# Patient Record
Sex: Female | Born: 1955 | Race: White | Hispanic: No | Marital: Single | Smoking: Current every day smoker
Health system: Southern US, Community
[De-identification: ages and names within clinical notes are randomized; demographics above are authoritative.]

## PROBLEM LIST (undated history)

## (undated) DIAGNOSIS — I10 Essential (primary) hypertension: Secondary | ICD-10-CM

## (undated) DIAGNOSIS — J45909 Unspecified asthma, uncomplicated: Secondary | ICD-10-CM

## (undated) DIAGNOSIS — J449 Chronic obstructive pulmonary disease, unspecified: Secondary | ICD-10-CM

## (undated) HISTORY — PX: HERNIA REPAIR: SHX51

---

## 2003-06-19 ENCOUNTER — Encounter: Payer: Self-pay | Admitting: Emergency Medicine

## 2003-06-19 ENCOUNTER — Emergency Department (HOSPITAL_COMMUNITY): Admission: EM | Admit: 2003-06-19 | Discharge: 2003-06-19 | Payer: Self-pay | Admitting: Emergency Medicine

## 2008-05-06 ENCOUNTER — Encounter: Admission: RE | Admit: 2008-05-06 | Discharge: 2008-05-06 | Payer: Self-pay | Admitting: Neurosurgery

## 2009-10-07 ENCOUNTER — Emergency Department (HOSPITAL_COMMUNITY): Admission: EM | Admit: 2009-10-07 | Discharge: 2009-10-08 | Payer: Self-pay | Admitting: Emergency Medicine

## 2010-06-09 ENCOUNTER — Emergency Department (HOSPITAL_COMMUNITY): Admission: EM | Admit: 2010-06-09 | Discharge: 2010-06-09 | Payer: Self-pay | Admitting: Emergency Medicine

## 2011-01-15 LAB — URINALYSIS, ROUTINE W REFLEX MICROSCOPIC
Glucose, UA: NEGATIVE mg/dL
Specific Gravity, Urine: 1.015 (ref 1.005–1.030)
pH: 6.5 (ref 5.0–8.0)

## 2011-01-15 LAB — DIFFERENTIAL
Basophils Absolute: 0 10*3/uL (ref 0.0–0.1)
Lymphocytes Relative: 10 % — ABNORMAL LOW (ref 12–46)
Monocytes Absolute: 1 10*3/uL (ref 0.1–1.0)
Monocytes Relative: 7 % (ref 3–12)
Neutro Abs: 12.8 10*3/uL — ABNORMAL HIGH (ref 1.7–7.7)
Neutrophils Relative %: 82 % — ABNORMAL HIGH (ref 43–77)

## 2011-01-15 LAB — URINE MICROSCOPIC-ADD ON

## 2011-01-15 LAB — URINE CULTURE: Colony Count: 50000

## 2011-01-15 LAB — BASIC METABOLIC PANEL
Calcium: 9.6 mg/dL (ref 8.4–10.5)
GFR calc Af Amer: >60 mL/min (ref >60)
GFR calc non Af Amer: >60 mL/min (ref >60)
Sodium: 139 mEq/L (ref 135–145)

## 2011-01-15 LAB — CBC
Hemoglobin: 13.8 g/dL (ref 12.0–15.0)
RBC: 4.77 MIL/uL (ref 3.87–5.11)
RDW: 14.2 % (ref 11.5–15.5)

## 2012-06-26 ENCOUNTER — Other Ambulatory Visit: Payer: Self-pay | Admitting: Family Medicine

## 2012-06-26 DIAGNOSIS — IMO0002 Reserved for concepts with insufficient information to code with codable children: Secondary | ICD-10-CM

## 2012-06-26 DIAGNOSIS — M545 Low back pain: Secondary | ICD-10-CM

## 2012-06-30 ENCOUNTER — Inpatient Hospital Stay: Admission: RE | Admit: 2012-06-30 | Payer: Self-pay | Source: Ambulatory Visit

## 2012-07-04 ENCOUNTER — Ambulatory Visit
Admission: RE | Admit: 2012-07-04 | Discharge: 2012-07-04 | Disposition: A | Discharge status: Home / Self Care | Payer: Medicare Other | Source: Ambulatory Visit | Attending: Family Medicine | Admitting: Family Medicine

## 2012-07-04 DIAGNOSIS — M545 Low back pain: Secondary | ICD-10-CM

## 2012-07-04 DIAGNOSIS — IMO0002 Reserved for concepts with insufficient information to code with codable children: Secondary | ICD-10-CM

## 2012-07-10 ENCOUNTER — Inpatient Hospital Stay: Admission: RE | Admit: 2012-07-10 | Payer: Medicare Other | Source: Ambulatory Visit

## 2012-07-17 ENCOUNTER — Ambulatory Visit
Admission: RE | Admit: 2012-07-17 | Discharge: 2012-07-17 | Disposition: A | Discharge status: Home / Self Care | Payer: Medicare Other | Source: Ambulatory Visit | Attending: Family Medicine | Admitting: Family Medicine

## 2014-01-29 ENCOUNTER — Emergency Department (HOSPITAL_COMMUNITY): Payer: Medicare Other

## 2014-01-29 ENCOUNTER — Encounter (HOSPITAL_COMMUNITY): Payer: Self-pay | Admitting: Emergency Medicine

## 2014-01-29 ENCOUNTER — Inpatient Hospital Stay (HOSPITAL_COMMUNITY)
Admission: EM | Admit: 2014-01-29 | Discharge: 2014-02-04 | DRG: 291 | Disposition: A | Discharge status: Home Health | Payer: Medicare Other | Attending: Internal Medicine | Admitting: Internal Medicine

## 2014-01-29 DIAGNOSIS — Z23 Encounter for immunization: Secondary | ICD-10-CM

## 2014-01-29 DIAGNOSIS — R0689 Other abnormalities of breathing: Secondary | ICD-10-CM

## 2014-01-29 DIAGNOSIS — F3289 Other specified depressive episodes: Secondary | ICD-10-CM | POA: Diagnosis present

## 2014-01-29 DIAGNOSIS — Z6841 Body Mass Index (BMI) 40.0 and over, adult: Secondary | ICD-10-CM

## 2014-01-29 DIAGNOSIS — E874 Mixed disorder of acid-base balance: Secondary | ICD-10-CM | POA: Diagnosis present

## 2014-01-29 DIAGNOSIS — I10 Essential (primary) hypertension: Secondary | ICD-10-CM | POA: Diagnosis present

## 2014-01-29 DIAGNOSIS — Z79899 Other long term (current) drug therapy: Secondary | ICD-10-CM

## 2014-01-29 DIAGNOSIS — F329 Major depressive disorder, single episode, unspecified: Secondary | ICD-10-CM | POA: Diagnosis present

## 2014-01-29 DIAGNOSIS — L03319 Cellulitis of trunk, unspecified: Secondary | ICD-10-CM

## 2014-01-29 DIAGNOSIS — J441 Chronic obstructive pulmonary disease with (acute) exacerbation: Secondary | ICD-10-CM

## 2014-01-29 DIAGNOSIS — I517 Cardiomegaly: Secondary | ICD-10-CM | POA: Diagnosis not present

## 2014-01-29 DIAGNOSIS — K5900 Constipation, unspecified: Secondary | ICD-10-CM | POA: Diagnosis present

## 2014-01-29 DIAGNOSIS — J449 Chronic obstructive pulmonary disease, unspecified: Secondary | ICD-10-CM | POA: Diagnosis present

## 2014-01-29 DIAGNOSIS — E662 Morbid (severe) obesity with alveolar hypoventilation: Secondary | ICD-10-CM | POA: Diagnosis present

## 2014-01-29 DIAGNOSIS — R0609 Other forms of dyspnea: Secondary | ICD-10-CM | POA: Diagnosis not present

## 2014-01-29 DIAGNOSIS — J9602 Acute respiratory failure with hypercapnia: Secondary | ICD-10-CM | POA: Diagnosis present

## 2014-01-29 DIAGNOSIS — F17200 Nicotine dependence, unspecified, uncomplicated: Secondary | ICD-10-CM | POA: Diagnosis present

## 2014-01-29 DIAGNOSIS — E876 Hypokalemia: Secondary | ICD-10-CM | POA: Diagnosis present

## 2014-01-29 DIAGNOSIS — Z720 Tobacco use: Secondary | ICD-10-CM | POA: Diagnosis present

## 2014-01-29 DIAGNOSIS — R0602 Shortness of breath: Secondary | ICD-10-CM | POA: Diagnosis not present

## 2014-01-29 DIAGNOSIS — J9600 Acute respiratory failure, unspecified whether with hypoxia or hypercapnia: Secondary | ICD-10-CM | POA: Diagnosis present

## 2014-01-29 DIAGNOSIS — J90 Pleural effusion, not elsewhere classified: Secondary | ICD-10-CM | POA: Diagnosis not present

## 2014-01-29 DIAGNOSIS — F1390 Sedative, hypnotic, or anxiolytic use, unspecified, uncomplicated: Secondary | ICD-10-CM | POA: Diagnosis present

## 2014-01-29 DIAGNOSIS — I509 Heart failure, unspecified: Secondary | ICD-10-CM | POA: Diagnosis present

## 2014-01-29 DIAGNOSIS — L02219 Cutaneous abscess of trunk, unspecified: Secondary | ICD-10-CM | POA: Diagnosis present

## 2014-01-29 DIAGNOSIS — K7689 Other specified diseases of liver: Secondary | ICD-10-CM | POA: Diagnosis not present

## 2014-01-29 DIAGNOSIS — G4733 Obstructive sleep apnea (adult) (pediatric): Secondary | ICD-10-CM | POA: Diagnosis present

## 2014-01-29 DIAGNOSIS — D259 Leiomyoma of uterus, unspecified: Secondary | ICD-10-CM | POA: Diagnosis present

## 2014-01-29 DIAGNOSIS — R7301 Impaired fasting glucose: Secondary | ICD-10-CM | POA: Diagnosis present

## 2014-01-29 DIAGNOSIS — R0989 Other specified symptoms and signs involving the circulatory and respiratory systems: Secondary | ICD-10-CM | POA: Diagnosis not present

## 2014-01-29 DIAGNOSIS — L0390 Cellulitis, unspecified: Secondary | ICD-10-CM

## 2014-01-29 DIAGNOSIS — F1310 Sedative, hypnotic or anxiolytic abuse, uncomplicated: Secondary | ICD-10-CM

## 2014-01-29 DIAGNOSIS — I5021 Acute systolic (congestive) heart failure: Principal | ICD-10-CM | POA: Diagnosis present

## 2014-01-29 HISTORY — DX: Unspecified asthma, uncomplicated: J45.909

## 2014-01-29 HISTORY — DX: Essential (primary) hypertension: I10

## 2014-01-29 HISTORY — DX: Chronic obstructive pulmonary disease, unspecified: J44.9

## 2014-01-29 LAB — BLOOD GAS, ARTERIAL
Acid-Base Excess: 9.3 mmol/L — ABNORMAL HIGH (ref 0.0–2.0)
Bicarbonate: 36.7 mEq/L — ABNORMAL HIGH (ref 20.0–24.0)
Delivery systems: POSITIVE
Drawn by: 32526
EXPIRATORY PAP: 8
FIO2: 0.4 %
INSPIRATORY PAP: 18
O2 Saturation: 93.9 %
PATIENT TEMPERATURE: 98.6
PO2 ART: 76.7 mmHg — AB (ref 80.0–100.0)
TCO2: 39.4 mmol/L (ref 0–100)
pCO2 arterial: 90 mmHg (ref 35.0–45.0)
pH, Arterial: 7.234 — ABNORMAL LOW (ref 7.350–7.450)

## 2014-01-29 LAB — I-STAT ARTERIAL BLOOD GAS, ED
ACID-BASE EXCESS: 7 mmol/L — AB (ref 0.0–2.0)
Acid-Base Excess: 8 mmol/L — ABNORMAL HIGH (ref 0.0–2.0)
BICARBONATE: 38.6 meq/L — AB (ref 20.0–24.0)
BICARBONATE: 38.7 meq/L — AB (ref 20.0–24.0)
O2 Saturation: 93 %
O2 Saturation: 94 %
PCO2 ART: 83.9 mmHg — AB (ref 35.0–45.0)
PH ART: 7.27 — AB (ref 7.350–7.450)
PH ART: 7.244 — AB (ref 7.350–7.450)
PO2 ART: 80 mmHg (ref 80.0–100.0)
Patient temperature: 98.1
TCO2: 41 mmol/L (ref 0–100)
TCO2: 41 mmol/L (ref 0–100)
pCO2 arterial: 89.5 mmHg (ref 35.0–45.0)
pO2, Arterial: 89 mmHg (ref 80.0–100.0)

## 2014-01-29 LAB — COMPREHENSIVE METABOLIC PANEL
ALK PHOS: 88 U/L (ref 39–117)
ALT: 47 U/L — ABNORMAL HIGH (ref 0–35)
AST: 36 U/L (ref 0–37)
Albumin: 3.2 g/dL — ABNORMAL LOW (ref 3.5–5.2)
BILIRUBIN TOTAL: 0.2 mg/dL — AB (ref 0.3–1.2)
BUN: 18 mg/dL (ref 6–23)
CHLORIDE: 102 meq/L (ref 96–112)
CO2: 33 meq/L — AB (ref 19–32)
Calcium: 9.4 mg/dL (ref 8.4–10.5)
Creatinine, Ser: 0.75 mg/dL (ref 0.50–1.10)
GFR calc Af Amer: >90 mL/min (ref >90)
Glucose, Bld: 111 mg/dL — ABNORMAL HIGH (ref 70–99)
POTASSIUM: 4.9 meq/L (ref 3.7–5.3)
SODIUM: 144 meq/L (ref 137–147)
Total Protein: 7.1 g/dL (ref 6.0–8.3)

## 2014-01-29 LAB — BASIC METABOLIC PANEL
BUN: 17 mg/dL (ref 6–23)
BUN: 17 mg/dL (ref 6–23)
CO2: 34 mEq/L — ABNORMAL HIGH (ref 19–32)
CO2: 33 mEq/L — ABNORMAL HIGH (ref 19–32)
Calcium: 9.4 mg/dL (ref 8.4–10.5)
Calcium: 9.5 mg/dL (ref 8.4–10.5)
Chloride: 102 mEq/L (ref 96–112)
Chloride: 98 mEq/L (ref 96–112)
Creatinine, Ser: 0.71 mg/dL (ref 0.50–1.10)
Creatinine, Ser: 0.72 mg/dL (ref 0.50–1.10)
GFR calc Af Amer: >90 mL/min (ref >90)
GFR calc Af Amer: >90 mL/min (ref >90)
GFR calc non Af Amer: >90 mL/min (ref >90)
GLUCOSE: 126 mg/dL — AB (ref 70–99)
Glucose, Bld: 130 mg/dL — ABNORMAL HIGH (ref 70–99)
POTASSIUM: 5.1 meq/L (ref 3.7–5.3)
Potassium: 4.9 mEq/L (ref 3.7–5.3)
Sodium: 146 mEq/L (ref 137–147)
Sodium: 143 mEq/L (ref 137–147)

## 2014-01-29 LAB — I-STAT TROPONIN, ED: Troponin i, poc: 0.05 ng/mL (ref 0.00–0.08)

## 2014-01-29 LAB — CBC WITH DIFFERENTIAL/PLATELET
Basophils Absolute: 0.1 10*3/uL (ref 0.0–0.1)
Basophils Relative: 1 % (ref 0–1)
EOS PCT: 2 % (ref 0–5)
Eosinophils Absolute: 0.2 10*3/uL (ref 0.0–0.7)
HEMATOCRIT: 47.7 % — AB (ref 36.0–46.0)
HEMOGLOBIN: 14.6 g/dL (ref 12.0–15.0)
LYMPHS PCT: 18 % (ref 12–46)
Lymphs Abs: 1.9 10*3/uL (ref 0.7–4.0)
MCH: 28.5 pg (ref 26.0–34.0)
MCHC: 30.6 g/dL (ref 30.0–36.0)
MCV: 93 fL (ref 78.0–100.0)
MONO ABS: 0.6 10*3/uL (ref 0.1–1.0)
MONOS PCT: 6 % (ref 3–12)
NEUTROS ABS: 7.9 10*3/uL — AB (ref 1.7–7.7)
Neutrophils Relative %: 73 % (ref 43–77)
Platelets: 284 10*3/uL (ref 150–400)
RBC: 5.13 MIL/uL — ABNORMAL HIGH (ref 3.87–5.11)
RDW: 15.9 % — ABNORMAL HIGH (ref 11.5–15.5)
WBC: 10.7 10*3/uL — AB (ref 4.0–10.5)

## 2014-01-29 LAB — INFLUENZA PANEL BY PCR (TYPE A & B)
H1N1FLUPCR: NOT DETECTED
Influenza A By PCR: NEGATIVE
Influenza B By PCR: NEGATIVE

## 2014-01-29 LAB — PRO B NATRIURETIC PEPTIDE: Pro B Natriuretic peptide (BNP): 1011 pg/mL — ABNORMAL HIGH (ref 0–125)

## 2014-01-29 LAB — I-STAT CHEM 8, ED
BUN: 19 mg/dL (ref 6–23)
CALCIUM ION: 1.22 mmol/L (ref 1.12–1.23)
CHLORIDE: 98 meq/L (ref 96–112)
CREATININE: 1 mg/dL (ref 0.50–1.10)
GLUCOSE: 108 mg/dL — AB (ref 70–99)
HCT: 49 % — ABNORMAL HIGH (ref 36.0–46.0)
Hemoglobin: 16.7 g/dL — ABNORMAL HIGH (ref 12.0–15.0)
Potassium: 4.5 mEq/L (ref 3.7–5.3)
Sodium: 142 mEq/L (ref 137–147)
TCO2: 37 mmol/L (ref 0–100)

## 2014-01-29 LAB — RAPID URINE DRUG SCREEN, HOSP PERFORMED
AMPHETAMINES: NOT DETECTED
BENZODIAZEPINES: POSITIVE — AB
Barbiturates: NOT DETECTED
Cocaine: NOT DETECTED
Opiates: POSITIVE — AB
Tetrahydrocannabinol: NOT DETECTED

## 2014-01-29 LAB — MAGNESIUM
Magnesium: 2 mg/dL (ref 1.5–2.5)
Magnesium: 2 mg/dL (ref 1.5–2.5)

## 2014-01-29 LAB — I-STAT CG4 LACTIC ACID, ED: Lactic Acid, Venous: 0.98 mmol/L (ref 0.5–2.2)

## 2014-01-29 LAB — TSH: TSH: 1.27 u[IU]/mL (ref 0.350–4.500)

## 2014-01-29 LAB — TROPONIN I
Troponin I: <0.3 ng/mL (ref <0.30)
Troponin I: <0.3 ng/mL (ref <0.30)

## 2014-01-29 LAB — HEMOGLOBIN A1C
Hgb A1c MFr Bld: 6 % — ABNORMAL HIGH (ref <5.7)
Mean Plasma Glucose: 126 mg/dL — ABNORMAL HIGH (ref <117)

## 2014-01-29 LAB — HIV ANTIBODY (ROUTINE TESTING W REFLEX): HIV 1&2 Ab, 4th Generation: NONREACTIVE

## 2014-01-29 LAB — MRSA PCR SCREENING: MRSA BY PCR: NEGATIVE

## 2014-01-29 LAB — SALICYLATE LEVEL: Salicylate Lvl: <2 mg/dL — ABNORMAL LOW (ref 2.8–20.0)

## 2014-01-29 MED ORDER — ALBUTEROL SULFATE (2.5 MG/3ML) 0.083% IN NEBU
5.0000 mg | INHALATION_SOLUTION | Freq: Once | RESPIRATORY_TRACT | Status: AC
  Administered 2014-01-29: 5 mg via RESPIRATORY_TRACT
  Filled 2014-01-29: qty 6

## 2014-01-29 MED ORDER — ALBUTEROL SULFATE (2.5 MG/3ML) 0.083% IN NEBU: 5.0000 mg | INHALATION_SOLUTION | RESPIRATORY_TRACT | Status: DC | PRN

## 2014-01-29 MED ORDER — ENOXAPARIN SODIUM 40 MG/0.4ML ~~LOC~~ SOLN
40.0000 mg | SUBCUTANEOUS | Status: DC
  Filled 2014-01-29: qty 0.4

## 2014-01-29 MED ORDER — SODIUM CHLORIDE 0.9 % IJ SOLN
3.0000 mL | Freq: Two times a day (BID) | INTRAMUSCULAR | Status: DC
  Administered 2014-01-29 – 2014-02-04 (×13): 3 mL via INTRAVENOUS

## 2014-01-29 MED ORDER — IPRATROPIUM BROMIDE 0.02 % IN SOLN
0.5000 mg | Freq: Once | RESPIRATORY_TRACT | Status: AC
  Administered 2014-01-29: 0.5 mg via RESPIRATORY_TRACT
  Filled 2014-01-29: qty 2.5

## 2014-01-29 MED ORDER — PIPERACILLIN-TAZOBACTAM 3.375 G IVPB
3.3750 g | Freq: Once | INTRAVENOUS | Status: DC
  Filled 2014-01-29 (×2): qty 50

## 2014-01-29 MED ORDER — IPRATROPIUM-ALBUTEROL 0.5-2.5 (3) MG/3ML IN SOLN
3.0000 mL | RESPIRATORY_TRACT | Status: DC
  Administered 2014-01-29 – 2014-02-03 (×29): 3 mL via RESPIRATORY_TRACT
  Filled 2014-01-29 (×29): qty 3

## 2014-01-29 MED ORDER — ENOXAPARIN SODIUM 80 MG/0.8ML ~~LOC~~ SOLN
0.5000 mg/kg | SUBCUTANEOUS | Status: DC
  Administered 2014-01-29 – 2014-02-03 (×6): 70 mg via SUBCUTANEOUS
  Filled 2014-01-29 (×7): qty 0.8

## 2014-01-29 MED ORDER — PREDNISONE 20 MG PO TABS
60.0000 mg | ORAL_TABLET | Freq: Once | ORAL | Status: AC
  Administered 2014-01-29: 60 mg via ORAL
  Filled 2014-01-29: qty 3

## 2014-01-29 MED ORDER — METHYLPREDNISOLONE SODIUM SUCC 125 MG IJ SOLR
80.0000 mg | Freq: Once | INTRAMUSCULAR | Status: AC
  Administered 2014-01-29: 80 mg via INTRAVENOUS
  Filled 2014-01-29: qty 1.28

## 2014-01-29 MED ORDER — VITAMIN D3 25 MCG (1000 UNIT) PO TABS
2000.0000 [IU] | ORAL_TABLET | Freq: Two times a day (BID) | ORAL | Status: DC
  Administered 2014-01-30 – 2014-02-04 (×11): 2000 [IU] via ORAL
  Filled 2014-01-29 (×14): qty 2

## 2014-01-29 MED ORDER — ONDANSETRON HCL 4 MG PO TABS: 4.0000 mg | ORAL_TABLET | Freq: Four times a day (QID) | ORAL | Status: DC | PRN

## 2014-01-29 MED ORDER — PIPERACILLIN-TAZOBACTAM 3.375 G IVPB
3.3750 g | Freq: Three times a day (TID) | INTRAVENOUS | Status: DC
  Filled 2014-01-29 (×2): qty 50

## 2014-01-29 MED ORDER — DOXYCYCLINE HYCLATE 100 MG PO TABS
100.0000 mg | ORAL_TABLET | Freq: Two times a day (BID) | ORAL | Status: AC
  Administered 2014-01-30 – 2014-02-03 (×10): 100 mg via ORAL
  Filled 2014-01-29 (×11): qty 1

## 2014-01-29 MED ORDER — SODIUM CHLORIDE 0.9 % IJ SOLN
3.0000 mL | Freq: Two times a day (BID) | INTRAMUSCULAR | Status: DC
  Administered 2014-01-29: 3 mL via INTRAVENOUS

## 2014-01-29 MED ORDER — ONDANSETRON HCL 4 MG/2ML IJ SOLN
4.0000 mg | Freq: Four times a day (QID) | INTRAMUSCULAR | Status: DC | PRN
  Administered 2014-01-30 – 2014-02-03 (×4): 4 mg via INTRAVENOUS
  Filled 2014-01-29 (×4): qty 2

## 2014-01-29 MED ORDER — FUROSEMIDE 10 MG/ML IJ SOLN
80.0000 mg | Freq: Two times a day (BID) | INTRAMUSCULAR | Status: DC
  Administered 2014-01-29: 80 mg via INTRAVENOUS
  Filled 2014-01-29 (×3): qty 8

## 2014-01-29 MED ORDER — WHITE PETROLATUM GEL
Status: AC
  Administered 2014-01-29: 0.2
  Filled 2014-01-29: qty 5

## 2014-01-29 MED ORDER — ACETAMINOPHEN 325 MG PO TABS
650.0000 mg | ORAL_TABLET | Freq: Four times a day (QID) | ORAL | Status: DC | PRN
  Administered 2014-01-30: 650 mg via ORAL
  Filled 2014-01-29: qty 2

## 2014-01-29 MED ORDER — SODIUM CHLORIDE 0.9 % IV SOLN: INTRAVENOUS | Status: DC

## 2014-01-29 MED ORDER — PNEUMOCOCCAL VAC POLYVALENT 25 MCG/0.5ML IJ INJ
0.5000 mL | INJECTION | INTRAMUSCULAR | Status: AC
  Administered 2014-01-30: 0.5 mL via INTRAMUSCULAR
  Filled 2014-01-29: qty 0.5

## 2014-01-29 MED ORDER — VANCOMYCIN HCL 10 G IV SOLR
2000.0000 mg | Freq: Once | INTRAVENOUS | Status: DC
  Filled 2014-01-29: qty 2000

## 2014-01-29 MED ORDER — IPRATROPIUM-ALBUTEROL 0.5-2.5 (3) MG/3ML IN SOLN: 3.0000 mL | RESPIRATORY_TRACT | Status: DC

## 2014-01-29 MED ORDER — IPRATROPIUM-ALBUTEROL 0.5-2.5 (3) MG/3ML IN SOLN: 3.0000 mL | Freq: Four times a day (QID) | RESPIRATORY_TRACT | Status: DC | PRN

## 2014-01-29 MED ORDER — LORATADINE 10 MG PO TABS
10.0000 mg | ORAL_TABLET | Freq: Every day | ORAL | Status: DC | PRN
  Filled 2014-01-29: qty 1

## 2014-01-29 MED ORDER — ACETAMINOPHEN 650 MG RE SUPP: 650.0000 mg | Freq: Four times a day (QID) | RECTAL | Status: DC | PRN

## 2014-01-29 MED ORDER — VANCOMYCIN HCL 10 G IV SOLR: 1750.0000 mg | INTRAVENOUS | Status: DC

## 2014-01-29 NOTE — Progress Notes (Signed)
Pt initially placed on droplet precautions to rule out flu. Pt PCR negative, precautions removed and pt/family educated. Will continue to monitor.

## 2014-01-29 NOTE — ED Notes (Signed)
Attempted report 

## 2014-01-29 NOTE — Progress Notes (Signed)
ANTICOAGULATION CONSULT NOTE - Initial Consult  Pharmacy Consult for Lovenox  Indication: VTE prophylaxis  Allergies  Allergen Reactions  . Biaxin [Clarithromycin]     Makes me burn  . Hydrocodone Itching  . Morphine And Related     Makes me crazy    Patient Measurements: Height: 4\' 11"  (149.9 cm) Weight: 306 lb 5 oz (138.942 kg) IBW/kg (Calculated) : 43.2 Heparin Dosing Weight: 139 kg  Vital Signs: Temp: 98.1 F (36.7 C) (04/17 0945) Temp src: Oral (04/17 0945) BP: 148/77 mmHg (04/17 1559) Pulse Rate: 84 (04/17 1559)  Labs:  Recent Labs  01/29/14 1025 01/29/14 1038 01/29/14 1136 01/29/14 1700  HGB 14.6 16.7*  --   --   HCT 47.7* 49.0*  --   --   PLT 284  --   --   --   CREATININE  --  1.00 0.75 0.71  TROPONINI  --   --  <0.30 <0.30    Estimated Creatinine Clearance: 101 ml/min (by C-G formula based on Cr of 0.71).   Medical History: Past Medical History  Diagnosis Date  . Hypertension   . Asthma   . COPD (chronic obstructive pulmonary disease)     Medications:  Prescriptions prior to admission  Medication Sig Dispense Refill  . albuterol (PROVENTIL HFA;VENTOLIN HFA) 108 (90 BASE) MCG/ACT inhaler Inhale into the lungs every 6 (six) hours as needed for wheezing or shortness of breath.      . budesonide-formoterol (SYMBICORT) 80-4.5 MCG/ACT inhaler Inhale 2 puffs into the lungs 2 (two) times daily.      . cholecalciferol (VITAMIN D) 1000 UNITS tablet Take 2,000 Units by mouth 2 (two) times daily.      Marland Kitchen ibuprofen (ADVIL,MOTRIN) 200 MG tablet Take 800 mg by mouth every 6 (six) hours as needed.      . loratadine (CLARITIN) 10 MG tablet Take 10 mg by mouth daily as needed for allergies.      . Psyllium (METAMUCIL PO) Take 2 capsules by mouth daily.      Marland Kitchen tiotropium (SPIRIVA) 18 MCG inhalation capsule Place 18 mcg into inhaler and inhale daily.        Assessment: 58 year old woman to start on Lovenox for VTE prophylaxis.  Patien weighs 139 kg.  Renal  function is normal.   Goal of Therapy:  VTE prevention   Plan:  Lovenox 70 mg SQ daily (0.5 mg/kg daily) Pharmacy will sign off.  Please reconsult if needed.  Gearldine Bienenstock Chin Wachter 01/29/2014,7:19 PM

## 2014-01-29 NOTE — Consult Note (Signed)
PULMONARY / CRITICAL CARE MEDICINE   Name: Ellen White MRN: [id number] DOB: 01/08/1956    ADMISSION DATE:  01/29/2014 CONSULTATION DATE:  4/17  REFERRING MD :  Dr. Murlean Caller PRIMARY SERVICE: IMTS  CHIEF COMPLAINT:  SOB  BRIEF PATIENT DESCRIPTION: Ellen White is a 58 yo white female with a PMH of COPD, asthma, tobacco abuse,  hx of incarcerated hernia with repair in 2010, who presents with progressive shortness of breath, productive cough, bilateral leg edema. PCCM was consulted because of worsening respiratory status.  SIGNIFICANT EVENTS / STUDIES:  - 4/17 BiPAP>>>  LINES / TUBES: 4/17 PIV>>>  CULTURES: -  respiratory virus panel>>>   ANTIBIOTICS: - Doxycycline 4/17 >>>  HISTORY OF PRESENT ILLNESS:    Ellen White is a 58 yo white female with a PMH of COPD, asthma, tobacco abuse,  hx of incarcerated hernia with repair in 2010, who presents with progressive shortness of breath.  Patient was supposed to use Symbicort, spirava and albuterol inhalers for COPD at home, but she has been only using albuterol inhaler for long time. She has been doing fine until 2 weeks ago when she started having cough and shortness of breath. Patient coughs up greenish colored sputum. She also had a fever and chills(she did not measure her body temperature at home). She denies any chest pain. She has a chronic bilateral leg pain, but denies new tenderness over her calf areas. His shortness of breath has been progressively getting worse, she was therefore taken to the emergency room for further evaluation  She does not use oxygen at home and was not intubated before. She has not seen her PCP for more than one year. Of note, she has chronic leg edema for which she has been intermittently taking water pills (medication name not clear). Patient may have had a sick contact with her grandson who is living with patient and had cold recently.   PAST MEDICAL HISTORY :  Past Medical History  Diagnosis Date  .  Hypertension   . Asthma   . COPD (chronic obstructive pulmonary disease)    Past Surgical History  Procedure Laterality Date  . Cesarean section    . Hernia repair     Prior to Admission medications   Medication Sig Start Date End Date Taking? Authorizing Provider  albuterol (PROVENTIL HFA;VENTOLIN HFA) 108 (90 BASE) MCG/ACT inhaler Inhale into the lungs every 6 (six) hours as needed for wheezing or shortness of breath.   Yes Historical Provider, MD  budesonide-formoterol (SYMBICORT) 80-4.5 MCG/ACT inhaler Inhale 2 puffs into the lungs 2 (two) times daily.   Yes Historical Provider, MD  cholecalciferol (VITAMIN D) 1000 UNITS tablet Take 2,000 Units by mouth 2 (two) times daily.   Yes Historical Provider, MD  ibuprofen (ADVIL,MOTRIN) 200 MG tablet Take 800 mg by mouth every 6 (six) hours as needed.   Yes Historical Provider, MD  loratadine (CLARITIN) 10 MG tablet Take 10 mg by mouth daily as needed for allergies.   Yes Historical Provider, MD  Psyllium (METAMUCIL PO) Take 2 capsules by mouth daily.   Yes Historical Provider, MD  tiotropium (SPIRIVA) 18 MCG inhalation capsule Place 18 mcg into inhaler and inhale daily.   Yes Historical Provider, MD   Allergies  Allergen Reactions  . Biaxin [Clarithromycin]     Makes me burn  . Hydrocodone Itching  . Morphine And Related     Makes me crazy    FAMILY HISTORY:  No family history on file. SOCIAL  HISTORY:  reports that she has been smoking Cigarettes.  She has a 20 pack-year smoking history. She does not have any smokeless tobacco history on file. She reports that she does not drink alcohol or use illicit drugs.  REVIEW OF SYSTEMS:    General: no fevers, chills Skin: has skin rash over abdominal wall HEENT: no blurry vision, hearing changes or sore throat Pulm: has yspnea, coughing, SOB CV: no chest pain, palpitations Abd: has nausea, No vomiting, abdominal pain, diarrhea/constipation GU: no dysuria, hematuria, polyuria Ext: has  knee pain bilaterally  Neuro: no weakness, numbness, or tingling   VITAL SIGNS: Temp:  [98.1 F (36.7 C)] 98.1 F (36.7 C) (04/17 0945) Pulse Rate:  [82-109] 82 (04/17 1245) Resp:  [15-25] 19 (04/17 1245) BP: (116-178)/(73-103) 139/77 mmHg (04/17 1245) SpO2:  [92 %-98 %] 95 % (04/17 1245) Weight:  [306 lb 5 oz (138.942 kg)] 306 lb 5 oz (138.942 kg) (04/17 0958) HEMODYNAMICS:   VENTILATOR SETTINGS:   INTAKE / OUTPUT: Intake/Output   None     PHYSICAL EXAMINATION: General:  Alter, in mild distress Neuro:  Grossly nonfocal HEENT: Neck supple, no JVD  Cardiovascular: S1/S2, regular rhythm and rate, no murmur  Lungs:  Difficult to ascultate given obesity, no wheezing or rales  Abdomen: Bowel sounds are normal. Has abdominal distension. Non tender Musculoskeletal: Bilateral 1+ pitting leg edema.  Musculoskeletal: has pain over knees bilaterally Skin: has pink rashes over the lower abdominal wall.   LABS:  CBC  Recent Labs Lab 01/29/14 1025 01/29/14 1038  WBC 10.7*  --   HGB 14.6 16.7*  HCT 47.7* 49.0*  PLT 284  --    Coag's No results found for this basename: APTT, INR,  in the last 168 hours BMET  Recent Labs Lab 01/29/14 1038 01/29/14 1136  NA 142 144  K 4.5 4.9  CL 98 102  CO2  --  33*  BUN 19 18  CREATININE 1.00 0.75  GLUCOSE 108* 111*   Electrolytes  Recent Labs Lab 01/29/14 1136  CALCIUM 9.4   Sepsis Markers  Recent Labs Lab 01/29/14 1038  LATICACIDVEN 0.98   ABG  Recent Labs Lab 01/29/14 1035 01/29/14 1253  PHART 7.270* 7.244*  PCO2ART 83.9* 89.5*  PO2ART 80.0 89.0   Liver Enzymes  Recent Labs Lab 01/29/14 1136  AST 36  ALT 47*  ALKPHOS 88  BILITOT 0.2*  ALBUMIN 3.2*   Cardiac Enzymes  Recent Labs Lab 01/29/14 1136  TROPONINI <0.30  PROBNP 1011.0*   Glucose No results found for this basename: GLUCAP,  in the last 168 hours  Imaging Dg Chest Port 1 View  01/29/2014   CLINICAL DATA:  Shortness of breath   EXAM: PORTABLE CHEST - 1 VIEW  COMPARISON:  DG CHEST 2V dated 05/27/2012  FINDINGS: Lung bases are partly omitted from the field of view. Moderate enlargement of the cardiac silhouette is reidentified with central vascular congestion. Allowing for technique, no focal consolidation. No pleural effusion.  IMPRESSION: Cardiomegaly with central vascular congestion. If symptoms persist, consider PA and lateral chest radiographs obtained at full inspiration when the patient is clinically able.   Electronically Signed   By: Conchita Paris M.D.   On: 01/29/2014 10:30   ASSESSMENT / PLAN:  PULMONARY A: - Acute hypercarbic respiratory failure, likely due to combined CHF exacerbation and COPD exacerbation, CHF exacerbation dominates - Hx of COPD and asthma, has not been compliant to home medications. Currently only takes albuterol Inhaler when necessary  -  No infiltration on chest x-ray  P:   - BiPAP - Scheduled nebulizers q2h - doxycyline oral  -Solu-Medrol 80 mg x1, followed by prednisone oral - Pending respiratory virus panel and Flu PCR  CARDIOVASCULAR A:  -hx of HTN, not on medications -Acute congestive heart failure exacerbation (unknown LVEF) chest x-ray is consistent with vascular congestion. Pro BNP slightly up 1101 . P:  - Lasix 80 mg bid - BMP/Mg bid - Pending 2 D echo  RENAL A:  No acute issue, electrolytes normal on admission  P:   F/u BMP for renal and electrolyte  GASTROINTESTINAL A:   - No acute issue P:   - monitor closely  HEMATOLOGIC A:  No acute issue P:  F/u CBC  INFECTIOUS A:  - COPD exacerbation, productive cough with greenish colored sputum, but no fever or chills, no leukocytosis, no infiltration on chest x-ray  P:   - Empiric coverage with oral doxycycline for 5 days  ENDOCRINE A:   -No hx of diabetes mellitus, and thyroid dysfunction   P:   - monitor CBC - TSH pending  NEUROLOGIC A:  No acute issues - patient's UDS positive for opioids and  benzodiazepine  P:   - Monitor closely for withdraw   Ivor Costa, MD PGY3, Internal Medicine Teaching Service Pager: [phone]   TODAY'S SUMMARY:  Attending:  I have seen and examined the patient with nurse practitioner/resident and agree with the note above.   This lady is quite ill, but is resting comfortably on BIPAP.  Not quite clear to me why she has had three ABG's drawn so closely together.  Though the rising CO2 is concerning, she is conversant, oriented to place, date and situation and is breathing comfortably on BIPAP.    She doesn't need intubation right now, but she is high risk for intubation.  She needs: - aggressive diuresis - close monitoring by nursing/housestaff - if she becomes more somnolent, and/or less responsive then it would be reasonable to repeat an ABG at that time.  For now, would watch carefully (frequent neuro exams) and continue current care  CC time 45 minutes  Roselie Awkward, MD [place] Pager: [phone] Cell: [phone] If no response, call [phone]  01/29/2014, 2:39 PM

## 2014-01-29 NOTE — Progress Notes (Signed)
ANTIBIOTIC CONSULT NOTE - INITIAL  Pharmacy Consult:  Vancomycin / Zosyn Indication:   Cellulitis  Allergies  Allergen Reactions  . Biaxin [Clarithromycin]     Makes me burn  . Hydrocodone Itching  . Morphine And Related     Makes me crazy    Patient Measurements: Height: 4\' 11"  (149.9 cm) Weight: 306 lb 5 oz (138.942 kg) IBW/kg (Calculated) : 43.2  Vital Signs: Temp: 98.1 F (36.7 C) (04/17 0945) Temp src: Oral (04/17 0945) BP: 139/77 mmHg (04/17 1245) Pulse Rate: 82 (04/17 1245)  Labs:  Recent Labs  01/29/14 1025 01/29/14 1038 01/29/14 1136  WBC 10.7*  --   --   HGB 14.6 16.7*  --   PLT 284  --   --   CREATININE  --  1.00 0.75   Estimated Creatinine Clearance: 101 ml/min (by C-G formula based on Cr of 0.75). No results found for this basename: VANCOTROUGH, VANCOPEAK, VANCORANDOM, GENTTROUGH, GENTPEAK, GENTRANDOM, TOBRATROUGH, TOBRAPEAK, TOBRARND, AMIKACINPEAK, AMIKACINTROU, AMIKACIN,  in the last 72 hours   Microbiology: No results found for this or any previous visit (from the past 720 hour(s)).  Medical History: Past Medical History  Diagnosis Date  . Hypertension   . Asthma   . COPD (chronic obstructive pulmonary disease)       Assessment: 58 year old morbidly obese female to start vancomycin and Zosyn for cellulitis.  No renal dysfunction, however; given patient's size it is difficult to estimate her actual renal clearance.   Goal of Therapy:  Vancomycin trough level 10-15 mcg/ml   Plan:  - Vanc 2gm IV x 1, then 1750mg  IV Q24H - Zosyn 3.375gm IV Q8H, 4 hr infusion - Monitor renal fxn, clinical course, vanc trough prior to 4th dose    Aryana Wonnacott D. Mina Marble, PharmD, BCPS Pager:  [phone] 01/29/2014, 2:17 PM

## 2014-01-29 NOTE — ED Provider Notes (Signed)
CSN: [id number]     Arrival date & time 01/29/14  2671 History   First MD Initiated Contact with Patient 01/29/14 1008     Chief Complaint  Patient presents with  . Chest Pain     (Consider location/radiation/quality/duration/timing/severity/associated sxs/prior Treatment) Patient is a 58 y.o. female presenting with chest pain. The history is provided by the patient.  Chest Pain  She complains of chest pain, shortness of breath, and weakness for several days. She has had a cough, nonproductive. The chest pain is left sided anterior, and mild. It feels like an ache. She does not use oxygen at home. She continues to smoke cigarettes. She uses inhalers to help her breathe. She has not seen her PCP recently. There are no other known modifying factors  Past Medical History  Diagnosis Date  . Hypertension   . Asthma   . COPD (chronic obstructive pulmonary disease)    Past Surgical History  Procedure Laterality Date  . Cesarean section    . Hernia repair     No family history on file. History  Substance Use Topics  . Smoking status: Current Every Day Smoker  . Smokeless tobacco: Not on file  . Alcohol Use: No   OB History   Grav Para Term Preterm Abortions TAB SAB Ect Mult Living                 Review of Systems  Cardiovascular: Positive for chest pain.  All other systems reviewed and are negative.     Allergies  Biaxin; Hydrocodone; and Morphine and related  Home Medications   Prior to Admission medications   Medication Sig Start Date End Date Taking? Authorizing Provider  cholecalciferol (VITAMIN D) 1000 UNITS tablet Take 2,000 Units by mouth 2 (two) times daily.   Yes Historical Provider, MD  ibuprofen (ADVIL,MOTRIN) 200 MG tablet Take 800 mg by mouth every 6 (six) hours as needed.   Yes Historical Provider, MD  loratadine (CLARITIN) 10 MG tablet Take 10 mg by mouth daily as needed for allergies.   Yes Historical Provider, MD  Psyllium (METAMUCIL PO) Take 2  capsules by mouth daily.   Yes Historical Provider, MD   BP 156/80  Pulse 91  Temp(Src) 98.1 F (36.7 C) (Oral)  Resp 17  Ht 4\' 11"  (1.499 m)  Wt 306 lb 5 oz (138.942 kg)  BMI 61.83 kg/m2  SpO2 97% Physical Exam  Nursing note and vitals reviewed. Constitutional: She is oriented to person, place, and time. She appears well-developed.  Morbid obesity, unkempt  HENT:  Head: Normocephalic and atraumatic.  Eyes: Conjunctivae and EOM are normal. Pupils are equal, round, and reactive to light.  Neck: Normal range of motion and phonation normal. Neck supple.  Cardiovascular: Normal rate, regular rhythm and intact distal pulses.   Pulmonary/Chest: Effort normal and breath sounds normal. No respiratory distress. She exhibits no tenderness.  Decreased air movement bilaterally  Abdominal: Soft. She exhibits no distension. There is no tenderness. There is no guarding.  Musculoskeletal: Normal range of motion.  Neurological: She is alert and oriented to person, place, and time. She exhibits normal muscle tone.  Skin: Skin is warm and dry.  Psychiatric: She has a normal mood and affect. Her behavior is normal. Judgment and thought content normal.    ED Course  Procedures (including critical care time) Medications  albuterol (PROVENTIL) (2.5 MG/3ML) 0.083% nebulizer solution 5 mg (5 mg Nebulization Given 01/29/14 1027)  ipratropium (ATROVENT) nebulizer solution 0.5 mg (0.5 mg  Nebulization Given 01/29/14 1027)  predniSONE (DELTASONE) tablet 60 mg (60 mg Oral Given 01/29/14 1056)    Patient Vitals for the past 24 hrs:  BP Temp Temp src Pulse Resp SpO2 Height Weight  01/29/14 1045 156/80 mmHg - - 91 17 97 % - -  01/29/14 1030 161/101 mmHg - - 96 21 98 % - -  01/29/14 1015 160/94 mmHg - - 101 19 96 % - -  01/29/14 1002 178/96 mmHg - - 109 21 94 % - -  01/29/14 0958 - - - - - - - 306 lb 5 oz (138.942 kg)  01/29/14 0945 169/103 mmHg 98.1 F (36.7 C) Oral 101 25 95 % 4\' 11"  (1.499 m) -    10:26  AM Reevaluation with update and discussion. After initial assessment and treatment, an updated evaluation reveals PE is unchanged. Richarda Blade   10:55- BiPAP ordered  11:00 AM-Consult complete with on-call [facility] resident. Patient case explained and discussed. She agrees to admit patient for further evaluation and treatment. Call ended at 11:20- I wrote holding orders for admission.  CRITICAL CARE Performed by: Richarda Blade Total critical care time: 45 minutes Critical care time was exclusive of separately billable procedures and treating other patients. Critical care was necessary to treat or prevent imminent or life-threatening deterioration. Critical care was time spent personally by me on the following activities: development of treatment plan with patient and/or surrogate as well as nursing, discussions with consultants, evaluation of patient's response to treatment, examination of patient, obtaining history from patient or surrogate, ordering and performing treatments and interventions, ordering and review of laboratory studies, ordering and review of radiographic studies, pulse oximetry and re-evaluation of patient's condition.  Labs Review Labs Reviewed  I-STAT CHEM 8, ED - Abnormal; Notable for the following:    Glucose, Bld 108 (*)    Hemoglobin 16.7 (*)    HCT 49.0 (*)    All other components within normal limits  I-STAT ARTERIAL BLOOD GAS, ED - Abnormal; Notable for the following:    pH, Arterial 7.270 (*)    pCO2 arterial 83.9 (*)    Bicarbonate 38.6 (*)    Acid-Base Excess 8.0 (*)    All other components within normal limits  CBC WITH DIFFERENTIAL  D-DIMER, QUANTITATIVE  BLOOD GAS, ARTERIAL  I-STAT TROPOININ, ED  I-STAT CG4 LACTIC ACID, ED    Imaging Review Dg Chest Port 1 View  01/29/2014   CLINICAL DATA:  Shortness of breath  EXAM: PORTABLE CHEST - 1 VIEW  COMPARISON:  DG CHEST 2V dated 05/27/2012  FINDINGS: Lung bases are partly omitted from the field of view.  Moderate enlargement of the cardiac silhouette is reidentified with central vascular congestion. Allowing for technique, no focal consolidation. No pleural effusion.  IMPRESSION: Cardiomegaly with central vascular congestion. If symptoms persist, consider PA and lateral chest radiographs obtained at full inspiration when the patient is clinically able.   Electronically Signed   By: Conchita Paris M.D.   On: 01/29/2014 10:30     EKG Interpretation None      MDM   Final diagnoses:  Hypercapnia  COPD exacerbation  Tobacco abuse  Respiratory failure, acute    Hypercapnic respiratory failure, secondary to COPD exacerbation, complicated by tobacco abuse. The patient will need admission for further treatment. In the inpatient setting.   Nursing Notes Reviewed/ Care Coordinated, and agree without changes. Applicable Imaging Reviewed.  Interpretation of Laboratory Data incorporated into ED treatment   Plan: Admit  Richarda Blade, MD 01/29/14 [phone]

## 2014-01-29 NOTE — ED Notes (Signed)
Pt reports couple weeks of center chest pain with shortness of breath. Pt pulse ox 72% on room air. Pt placed on 6 L O2 via [place], pulse ox increased to 94%. Pt with labored breathing. Pt also reports history of hernia. Pt talking in short choppy sentences.

## 2014-01-29 NOTE — H&P (Signed)
Date: 01/29/2014               Patient Name:  Ellen White MRN: [id number]  DOB: 22-May-1956 Age / Sex: 58 y.o., female   PCP: No Pcp Per Patient         Medical Service: Internal Medicine Teaching Service         Attending Physician: Dr. Dominic Pea, DO    First Contact: Dr. Joni Reining, DO  Pager: [phone]  Second Contact: Dr. Gaspar Bidding Pager: [phone]       After Hours (After 5p/  First Contact Pager: [phone]  weekends / holidays): Second Contact Pager: [phone]   Chief Complaint: SOB  History of Present Illness: KIDA DIGIULIO is a 58 yo white female with a PMH of COPD, Asthma?, Tobacco abuse, incarcerated hernia with repair in 2010. She is accompanied by her two daughters, she is currently on bipap, history is provided by the daughters and the patient.  Per the daughters patient has not seen her PCP in over a year and has been suffering from some depression since her mother died last year.  Recently they have noticed that she has become more "puffy" and "swollen."  In addition they note that she has gotten progressively SOB at home over the past two weeks.  They do note that she had a "cold a little over two weeks ago but recovered from that prior to getting short of breath.  The daughter's were concerned about her and tried to schedule an appointment with her PCP (was supposed to happen today) however this morning she was even more short of breath and "panting and wheezing" and they decided to bring her to the hospital instead.  They do note that she has been using Albuterol at home PRN (ran out of her own but uses grandson's) has run out of symbicort and spirivia.  She has been prescribed Lasix for lower extremity edema (no known heart failure) but has been "saving her last pill until the swelling gets worse." She reports a chronic cough - non-productive, smokes 1-2 ppd, denies fever/chills, reports wheezing piror to arival, chronic 3 pillow orthopnea. In the ED shoe was found  to be hypoxic, she was given duoneb breathing teratment, 60mg  PO prednisone, an ABG was checked which confirmed hypercarbic respiratory failure with a chronic respiratory acidosis. She was started on BIPAP and IMTS was asked to admit.  Meds: No current facility-administered medications for this encounter.   Current Outpatient Prescriptions  Medication Sig Dispense Refill  . albuterol (PROVENTIL HFA;VENTOLIN HFA) 108 (90 BASE) MCG/ACT inhaler Inhale into the lungs every 6 (six) hours as needed for wheezing or shortness of breath.      . budesonide-formoterol (SYMBICORT) 80-4.5 MCG/ACT inhaler Inhale 2 puffs into the lungs 2 (two) times daily.      . cholecalciferol (VITAMIN D) 1000 UNITS tablet Take 2,000 Units by mouth 2 (two) times daily.      Marland Kitchen ibuprofen (ADVIL,MOTRIN) 200 MG tablet Take 800 mg by mouth every 6 (six) hours as needed.      . loratadine (CLARITIN) 10 MG tablet Take 10 mg by mouth daily as needed for allergies.      . Psyllium (METAMUCIL PO) Take 2 capsules by mouth daily.      Marland Kitchen tiotropium (SPIRIVA) 18 MCG inhalation capsule Place 18 mcg into inhaler and inhale daily.        Allergies: Allergies as of 01/29/2014 - Review Complete 01/29/2014  Allergen Reaction Noted  .  Biaxin [clarithromycin]  01/29/2014  . Hydrocodone Itching 01/29/2014  . Morphine and related  01/29/2014   Past Medical History  Diagnosis Date  . Hypertension   . Asthma   . COPD (chronic obstructive pulmonary disease)    Past Surgical History  Procedure Laterality Date  . Cesarean section    . Hernia repair     No family history on file. History   Social History  . Marital Status: Single    Spouse Name: N/A    Number of Children: N/A  . Years of Education: N/A   Occupational History  . Not on file.   Social History Main Topics  . Smoking status: Current Every Day Smoker -- 1.00 packs/day for 20 years    Types: Cigarettes  . Smokeless tobacco: Not on file  . Alcohol Use: No  . Drug  Use: No  . Sexual Activity: Not on file   Other Topics Concern  . Not on file   Social History Narrative  . No narrative on file    Review of Systems: Review of Systems  Constitutional: Negative for fever, chills, weight loss (weight gain) and malaise/fatigue.  HENT: Negative for congestion and sore throat.   Eyes: Negative for blurred vision.  Respiratory: Positive for cough, shortness of breath and wheezing. Negative for sputum production.   Cardiovascular: Positive for orthopnea (3 pillow) and leg swelling. Negative for chest pain.  Gastrointestinal: Negative for heartburn, abdominal pain, diarrhea, constipation, blood in stool and melena.  Genitourinary: Negative for dysuria and frequency.  Musculoskeletal: Negative for myalgias.  Skin: Positive for itching (around stomach).  Neurological: Negative for dizziness and headaches.  Psychiatric/Behavioral: Positive for depression (per daughter).     Physical Exam: Blood pressure 139/77, pulse 82, temperature 98.1 F (36.7 C), temperature source Oral, resp. rate 19, height 4\' 11"  (1.499 m), weight 306 lb 5 oz (138.942 kg), SpO2 95.00%. Physical Exam  Nursing note and vitals reviewed. Constitutional: She is oriented to person, place, and time.  Obese woman, on Bipap, oriented and able to communicate  HENT:  Head: Normocephalic and atraumatic.  Eyes: Conjunctivae are normal.  Cardiovascular: Normal rate, regular rhythm, normal heart sounds and intact distal pulses.   Pulmonary/Chest:  Difficult to ascultate given obesity, no appreciated wheezing or rales  Abdominal: Bowel sounds are normal. She exhibits distension (upper abdomen appears more distended than lower quadrants). There is tenderness (mild tenderness in LUQ and RLQ).  Musculoskeletal: She exhibits edema (1-2+ bilaterally).  Neurological: She is alert and oriented to person, place, and time.  Skin:  Minor cellulitis across abdomen     Lab results: Basic Metabolic  Panel:  Recent Labs  01/29/14 1038 01/29/14 1136  NA 142 144  K 4.5 4.9  CL 98 102  CO2  --  33*  GLUCOSE 108* 111*  BUN 19 18  CREATININE 1.00 0.75  CALCIUM  --  9.4   Liver Function Tests:  Recent Labs  01/29/14 1136  AST 36  ALT 47*  ALKPHOS 88  BILITOT 0.2*  PROT 7.1  ALBUMIN 3.2*   CBC:  Recent Labs  01/29/14 1025 01/29/14 1038  WBC 10.7*  --   NEUTROABS 7.9*  --   HGB 14.6 16.7*  HCT 47.7* 49.0*  MCV 93.0  --   PLT 284  --    Cardiac Enzymes:  Recent Labs  01/29/14 1136  TROPONINI <0.30   BNP:  Recent Labs  01/29/14 1136  PROBNP 1011.0*   Imaging results:  Dg  Chest Port 1 View  01/29/2014   CLINICAL DATA:  Shortness of breath  EXAM: PORTABLE CHEST - 1 VIEW  COMPARISON:  DG CHEST 2V dated 05/27/2012  FINDINGS: Lung bases are partly omitted from the field of view. Moderate enlargement of the cardiac silhouette is reidentified with central vascular congestion. Allowing for technique, no focal consolidation. No pleural effusion.  IMPRESSION: Cardiomegaly with central vascular congestion. If symptoms persist, consider PA and lateral chest radiographs obtained at full inspiration when the patient is clinically able.   Electronically Signed   By: Conchita Paris M.D.   On: 01/29/2014 10:30    Other results:   Recent Labs Lab 01/29/14 1035 01/29/14 1038 01/29/14 1253  PHART 7.270*  --  7.244*  PCO2ART 83.9*  --  89.5*  PO2ART 80.0  --  89.0  HCO3 38.6*  --  38.7*  TCO2 41 37 41  O2SAT 93.0  --  94.0    EKG: sinus tachycardia, normal axis, low voltage, poor R wave progression, no previous available for comparison  Assessment & Plan by Problem:   Acute respiratory failure with hypercapnia - Ddx Acute Congestive heart failure vs/? combined COPD exacerbation.  Her initial ABG suggest this is a more chronic issue given her compensated respiratory acidosis.  She appears to have a poor tidal volume and given her distended stomach I feel that volume  overload is contiributing to her acute respiratry failure from increased abdominal pressure. - Admit to inpatient Step down - Repeat ABG on Bipap >>> worsening acidosis, increase PEEP, FiO2, repeat ABG in 30 minutes - Start Zosyn given possible COPD exacerbation with respiratory failure, no obvious infiltrate on CXR. - Start IV lasix as below. - If further deterioration or no improvement of ABG will consult Pulmonology/CC. -Check TSH -Trend cardiac enzymes  Acute congestive heart failure - Give Lasix IV 80mg  BID, monitor UOP closely - Daily weights - Insert foley and Strict I&O - TTE  COPD exacerbation -Currently no wheezing or productive cough.  Patient apparently noncompliant with medical follow up/ medications. - Continue Dueonebs q4 - Trend ABG - Likely need to consult Pulmonology if no improvement. -Flu panel  Morbid Obesity -Check Hgb A1c    DVT PPX: Diet: NPO (bipap) Code Status: Full Dispo: Disposition is deferred at this time, awaiting improvement of current medical problems. Anticipated discharge in approximately 3 day(s).   The patient does have a current PCP (No Pcp Per Patient) and does not need an [facility] hospital follow-up appointment after discharge.  The patient does not have transportation limitations that hinder transportation to clinic appointments.  Signed: Joni Reining, DO 01/29/2014, 1:02 PM

## 2014-01-29 NOTE — Progress Notes (Signed)
CRITICAL VALUE ALERT  Critical value received: pH 7.23  Date of notification:  01/29/14  Time of notification:  1500  Critical value read back:yes  Nurse who received alert:  Katharina Caper RN  MD notified (1st page):  Blaine Hamper, MD  Time of first page:  1505  MD notified (2nd page):  Time of second page:  Responding MD:  Blaine Hamper, MD  Time MD responded:  [phone]

## 2014-01-29 NOTE — Progress Notes (Signed)
Received report from Angela RN.

## 2014-01-30 DIAGNOSIS — R7301 Impaired fasting glucose: Secondary | ICD-10-CM | POA: Diagnosis present

## 2014-01-30 DIAGNOSIS — I509 Heart failure, unspecified: Secondary | ICD-10-CM | POA: Diagnosis not present

## 2014-01-30 DIAGNOSIS — E874 Mixed disorder of acid-base balance: Secondary | ICD-10-CM | POA: Diagnosis not present

## 2014-01-30 DIAGNOSIS — J441 Chronic obstructive pulmonary disease with (acute) exacerbation: Secondary | ICD-10-CM | POA: Diagnosis not present

## 2014-01-30 DIAGNOSIS — J9600 Acute respiratory failure, unspecified whether with hypoxia or hypercapnia: Secondary | ICD-10-CM | POA: Diagnosis not present

## 2014-01-30 DIAGNOSIS — L0390 Cellulitis, unspecified: Secondary | ICD-10-CM | POA: Diagnosis present

## 2014-01-30 DIAGNOSIS — L02219 Cutaneous abscess of trunk, unspecified: Secondary | ICD-10-CM | POA: Diagnosis not present

## 2014-01-30 DIAGNOSIS — I5021 Acute systolic (congestive) heart failure: Secondary | ICD-10-CM | POA: Diagnosis not present

## 2014-01-30 DIAGNOSIS — I517 Cardiomegaly: Secondary | ICD-10-CM | POA: Diagnosis not present

## 2014-01-30 LAB — GLUCOSE, CAPILLARY: Glucose-Capillary: 118 mg/dL — ABNORMAL HIGH (ref 70–99)

## 2014-01-30 LAB — BASIC METABOLIC PANEL
BUN: 23 mg/dL (ref 6–23)
BUN: 25 mg/dL — ABNORMAL HIGH (ref 6–23)
CALCIUM: 9.4 mg/dL (ref 8.4–10.5)
CALCIUM: 9.6 mg/dL (ref 8.4–10.5)
CHLORIDE: 100 meq/L (ref 96–112)
CO2: 38 mEq/L — ABNORMAL HIGH (ref 19–32)
CO2: 40 mEq/L (ref 19–32)
CREATININE: 0.9 mg/dL (ref 0.50–1.10)
CREATININE: 0.91 mg/dL (ref 0.50–1.10)
Chloride: 97 mEq/L (ref 96–112)
GFR calc Af Amer: 81 mL/min — ABNORMAL LOW (ref >90)
GFR, EST AFRICAN AMERICAN: 80 mL/min — AB (ref >90)
GFR, EST NON AFRICAN AMERICAN: 70 mL/min — AB (ref >90)
GFR, EST NON AFRICAN AMERICAN: 69 mL/min — AB (ref >90)
Glucose, Bld: 113 mg/dL — ABNORMAL HIGH (ref 70–99)
Glucose, Bld: 101 mg/dL — ABNORMAL HIGH (ref 70–99)
Potassium: 5.1 mEq/L (ref 3.7–5.3)
Potassium: 4.4 mEq/L (ref 3.7–5.3)
Sodium: 146 mEq/L (ref 137–147)
Sodium: 145 mEq/L (ref 137–147)

## 2014-01-30 LAB — MAGNESIUM
MAGNESIUM: 2.2 mg/dL (ref 1.5–2.5)
MAGNESIUM: 2.2 mg/dL (ref 1.5–2.5)

## 2014-01-30 LAB — RESPIRATORY VIRUS PANEL
Adenovirus: NOT DETECTED
INFLUENZA A H3: NOT DETECTED
Influenza A H1: NOT DETECTED
Influenza A: NOT DETECTED
Influenza B: NOT DETECTED
Metapneumovirus: NOT DETECTED
PARAINFLUENZA 1 A: NOT DETECTED
PARAINFLUENZA 3 A: NOT DETECTED
Parainfluenza 2: NOT DETECTED
RESPIRATORY SYNCYTIAL VIRUS A: NOT DETECTED
Respiratory Syncytial Virus B: NOT DETECTED
Rhinovirus: NOT DETECTED

## 2014-01-30 LAB — CBC
HCT: 46.3 % — ABNORMAL HIGH (ref 36.0–46.0)
Hemoglobin: 13.7 g/dL (ref 12.0–15.0)
MCH: 28.1 pg (ref 26.0–34.0)
MCHC: 29.6 g/dL — ABNORMAL LOW (ref 30.0–36.0)
MCV: 94.9 fL (ref 78.0–100.0)
Platelets: 296 10*3/uL (ref 150–400)
RBC: 4.88 MIL/uL (ref 3.87–5.11)
RDW: 16 % — AB (ref 11.5–15.5)
WBC: 9.9 10*3/uL (ref 4.0–10.5)

## 2014-01-30 LAB — TROPONIN I: Troponin I: <0.3 ng/mL (ref <0.30)

## 2014-01-30 MED ORDER — IBUPROFEN 600 MG PO TABS
600.0000 mg | ORAL_TABLET | Freq: Four times a day (QID) | ORAL | Status: AC | PRN
  Administered 2014-01-31 – 2014-02-01 (×2): 600 mg via ORAL
  Filled 2014-01-30 (×2): qty 1

## 2014-01-30 MED ORDER — FUROSEMIDE 10 MG/ML IJ SOLN
80.0000 mg | Freq: Three times a day (TID) | INTRAMUSCULAR | Status: DC
  Administered 2014-01-30 – 2014-02-01 (×9): 80 mg via INTRAVENOUS
  Filled 2014-01-30 (×12): qty 8

## 2014-01-30 NOTE — Progress Notes (Signed)
  Echocardiogram 2D Echocardiogram has been performed.  [place] 01/30/2014, 4:24 PM

## 2014-01-30 NOTE — Progress Notes (Signed)
Subjective: On Bipap, conversant.  No acute complaints.  Has diuresed well with first dose of IV lasix but poor I&O documentation. Objective: Vital signs in last 24 hours: Filed Vitals:   01/30/14 0359 01/30/14 0700 01/30/14 0737 01/30/14 1005  BP: 129/74  106/60   Pulse: 85  90 80  Temp: 97.4 F (36.3 C) 98.2 F (36.8 C)    TempSrc: Oral Axillary    Resp: 17  15 18   Height:      Weight: 305 lb 8.9 oz (138.6 kg)     SpO2: 94%  92% 95%   Weight change:   Intake/Output Summary (Last 24 hours) at 01/30/14 1106 Last data filed at 01/29/14 1700  Gross per 24 hour  Intake      0 ml  Output   1375 ml  Net  -1375 ml   General: on Bipap, NAD HEENT: EOMI Cardiac: RRR, no rubs, murmurs or gallops Pulm: no appreciated rales or wheezing but difficult to auscultate given body habitus Abd: soft, nontender, +distended, BS present Ext: warm and well perfused, 1+ pedal edema Neuro: alert and oriented X3  Lab Results: Basic Metabolic Panel:  Recent Labs Lab 01/29/14 1836 01/30/14 0802  NA 143 146  K 5.1 5.1  CL 98 100  CO2 33* 38*  GLUCOSE 130* 113*  BUN 17 23  CREATININE 0.72 0.90  CALCIUM 9.5 9.4  MG 2.0 2.2   Liver Function Tests:  Recent Labs Lab 01/29/14 1136  AST 36  ALT 47*  ALKPHOS 88  BILITOT 0.2*  PROT 7.1  ALBUMIN 3.2*   CBC:  Recent Labs Lab 01/29/14 1025 01/29/14 1038 01/30/14 0310  WBC 10.7*  --  9.9  NEUTROABS 7.9*  --   --   HGB 14.6 16.7* 13.7  HCT 47.7* 49.0* 46.3*  MCV 93.0  --  94.9  PLT 284  --  296   Cardiac Enzymes:  Recent Labs Lab 01/29/14 1136 01/29/14 1700 01/29/14 2317  TROPONINI <0.30 <0.30 <0.30   BNP:  Recent Labs Lab 01/29/14 1136  PROBNP 1011.0*   CBG:  Recent Labs Lab 01/30/14 0739  GLUCAP 118*   Hemoglobin A1C:  Recent Labs Lab 01/29/14 1545  HGBA1C 6.0*   Thyroid Function Tests:  Recent Labs Lab 01/29/14 1545  TSH 1.270   Urine Drug Screen: Drugs of Abuse     Component Value  Date/Time   LABOPIA POSITIVE* 01/29/2014 1300   COCAINSCRNUR NONE DETECTED 01/29/2014 1300   LABBENZ POSITIVE* 01/29/2014 1300   AMPHETMU NONE DETECTED 01/29/2014 1300   THCU NONE DETECTED 01/29/2014 1300   LABBARB NONE DETECTED 01/29/2014 1300     Micro Results: Recent Results (from the past 240 hour(s))  MRSA PCR SCREENING     Status: None   Collection Time    01/29/14  2:43 PM      Result Value Ref Range Status   MRSA by PCR NEGATIVE  NEGATIVE Final   Comment:            The GeneXpert MRSA Assay (FDA     approved for NASAL specimens     only), is one component of a     comprehensive MRSA colonization     surveillance program. It is not     intended to diagnose MRSA     infection nor to guide or     monitor treatment for     MRSA infections.   Studies/Results: Dg Chest Port 1 View  01/29/2014   CLINICAL  DATA:  Shortness of breath  EXAM: PORTABLE CHEST - 1 VIEW  COMPARISON:  DG CHEST 2V dated 05/27/2012  FINDINGS: Lung bases are partly omitted from the field of view. Moderate enlargement of the cardiac silhouette is reidentified with central vascular congestion. Allowing for technique, no focal consolidation. No pleural effusion.  IMPRESSION: Cardiomegaly with central vascular congestion. If symptoms persist, consider PA and lateral chest radiographs obtained at full inspiration when the patient is clinically able.   Electronically Signed   By: Conchita Paris M.D.   On: 01/29/2014 10:30   Medications: I have reviewed the patient's current medications. Scheduled Meds: . cholecalciferol  2,000 Units Oral BID  . doxycycline  100 mg Oral Q12H  . enoxaparin (LOVENOX) injection  0.5 mg/kg Subcutaneous Q24H  . furosemide  80 mg Intravenous TID  . ipratropium-albuterol  3 mL Nebulization Q4H  . sodium chloride  3 mL Intravenous Q12H   Continuous Infusions:  PRN Meds:.acetaminophen, acetaminophen, albuterol, loratadine, ondansetron (ZOFRAN) IV, ondansetron Assessment/Plan: Acute  respiratory failure with hypercapnia  - Multifactorial, primary Acute congestive heart failure but compounded with chronic hypercapnia from COPD, possible COPD exacerbation, and likely obesity hypoventilation syndrome. - Aggressive diuresis as below - Continue BiPAP can remove with close monitoring to take oral medications. -Continue NPO at this time but will address later today if less somnolent  -Neuro checks Q4, stimulate for better alertness and deeper breathing - ABG if more somnolent  Acute congestive heart failure  - Lasix IV 80mg  TID - Daily weights  - Insert foley (aggressive diuresis in critically ill patient) and Strict I&O  - TTE  - Monitor electrolytes with BMP BID  COPD exacerbation  -empiric Doxycycline -Duoneb q4 - Bipap to maintain O2 saturation >92%  Cellulitis -Empiric doxycycline  Morbid Obesity  - Likely contributory to respiratory status, will try to avoid intubation if possible.  Impaired fasting glucose -A1c 6.0  Dispo: Disposition is deferred at this time, awaiting improvement of current medical problems.    The patient does have a current PCP (No Pcp Per Patient) and does not need an [facility] hospital follow-up appointment after discharge.  The patient does not have transportation limitations that hinder transportation to clinic appointments.  .Services Needed at time of discharge: Y = Yes, Blank = No PT:   OT:   RN:   Equipment:   Other:     LOS: 1 day   Joni Reining, DO 01/30/2014, 11:06 AM

## 2014-01-30 NOTE — H&P (Signed)
INTERNAL MEDICINE TEACHING SERVICE Attending Admission Note  Date: 01/30/2014  Patient name: Ellen White  Medical record number: [id number]  Date of birth: 01-19-1956    I have seen and evaluated Ellen White and discussed their care with the Residency Team.  58 yr old woman with hx COPD, morbid obesity, hx incarcerated hernia repair in 2010, presented with SOB. One of her daughters stated her abdomen and neck have become more swollen recently. She has also developed abdominal erythema. She is supposed to be on some inhalers but has not been taking these as prescribed (?) and has not been consistently taking lasix. She was noted to be hypoxic in the ED in addition to having evidence of hypercarbia and thus placed on BiPAP. On exam, Filed Vitals:   01/30/14 1005  BP:   Pulse: 80  Temp:   Resp: 18  BP 106/60 RR 15 O2 Sat 95% Bipap (40%, 12/8)  CXR with evidence of central congestion, but limited due to body habitus.  GEN: AAOx3, with BiPAP on (was asking me for water when she took mask off) CV: S1S2, no m/r/g, RRR PULM: No definite wheeze, decreased breath sounds. ABD/GI: erythema along previous surgical site, edema, obese, +BS. +tender but no guarding. LE: 1-2 + pitting edema.  -Acute hypoxic/hypercarbic respiratory failure: Appreciate Pulm consult. She has evidence of HF on exam, and I suspect RHF. Obtain TTE. For now, continue lasix 80 mg IV tid. Follow BMP twice daily along with mg. Agree with solumedrol and can change to prednisone once she can take oral meds safely. Watch mental status, she may need intubation if worsens. Agree with doxy for now. -Abdominal wall cellulits/stasis dermatitis?: Agree with doxy. -rest per resident note.  Dominic Pea, DO, [facility] 01/30/2014, 10:44 AM

## 2014-01-30 NOTE — Progress Notes (Signed)
PULMONARY / CRITICAL CARE MEDICINE   Name: Ellen White MRN: [id number] DOB: April 07, 1956    ADMISSION DATE:  01/29/2014 CONSULTATION DATE:  4/17  REFERRING MD :  Dr. Murlean Caller PRIMARY SERVICE: IMTS  CHIEF COMPLAINT:  SOB  BRIEF PATIENT DESCRIPTION: Ellen White is a 58 yo white female with a PMH of COPD, asthma, tobacco abuse,  hx of incarcerated hernia with repair in 2010, who presents with progressive shortness of breath, productive cough, bilateral leg edema. PCCM was consulted because of worsening respiratory status.  SIGNIFICANT EVENTS / STUDIES:  - 4/17 BiPAP>>>  LINES / TUBES: 4/17 PIV>>>  CULTURES: -  respiratory virus panel>>>   ANTIBIOTICS: - Doxycycline 4/17 >>>  HISTORY OF PRESENT ILLNESS:    Ellen White is a 58 yo white female with a PMH of COPD, asthma, tobacco abuse,  hx of incarcerated hernia with repair in 2010, who presents with progressive shortness of breath.  Patient was supposed to use Symbicort, spirava and albuterol inhalers for COPD at home, but she has been only using albuterol inhaler for long time. She has been doing fine until 2 weeks ago when she started having cough and shortness of breath. Patient coughs up greenish colored sputum. She also had a fever and chills(she did not measure her body temperature at home). She denies any chest pain. She has a chronic bilateral leg pain, but denies new tenderness over her calf areas. His shortness of breath has been progressively getting worse, she was therefore taken to the emergency room for further evaluation  She does not use oxygen at home and was not intubated before. She has not seen her PCP for more than one year. Of note, she has chronic leg edema for which she has been intermittently taking water pills (medication name not clear). Patient may have had a sick contact with her grandson who is living with patient and had cold recently.   Subjective: Somnolent. No report of acute concerns  VITAL  SIGNS: Temp:  [97.4 F (36.3 C)-99.1 F (37.3 C)] 98.2 F (36.8 C) (04/18 0700) Pulse Rate:  [77-109] 85 (04/18 0359) Resp:  [14-25] 17 (04/18 0359) BP: (115-178)/(67-103) 129/74 mmHg (04/18 0359) SpO2:  [90 %-98 %] 94 % (04/18 0359) FiO2 (%):  [40 %-60 %] 60 % (04/18 0359) Weight:  [305 lb 8.9 oz (138.6 kg)-306 lb 5 oz (138.942 kg)] 305 lb 8.9 oz (138.6 kg) (04/18 0359) HEMODYNAMICS:   VENTILATOR SETTINGS: Vent Mode:  [-]  FiO2 (%):  [40 %-60 %] 60 % INTAKE / OUTPUT: Intake/Output     04/17 0701 - 04/18 0700 04/18 0701 - 04/19 0700   Urine (mL/kg/hr) 1375    Total Output 1375     Net -1375            PHYSICAL EXAMINATION: General: Asleep on BIPAP Neuro:  Twitched to light touch on leg. Did not open eyes to voice or light touch HEENT: Neck supple, no JVD  Cardiovascular: S1/S2, regular rhythm and rate, no murmur  Lungs:  Difficult to ascultate given obesity, no wheezing or rales  Abdomen: Bowel sounds are normal. Has abdominal distension. Non tender. Obese Musculoskeletal : no edema  Musculoskeletal: symmetrical muscle bulk Skin:No rash on exposed skin  LABS:  CBC  Recent Labs Lab 01/29/14 1025 01/29/14 1038 01/30/14 0310  WBC 10.7*  --  9.9  HGB 14.6 16.7* 13.7  HCT 47.7* 49.0* 46.3*  PLT 284  --  296   Coag's No results found for  this basename: APTT, INR,  in the last 168 hours BMET  Recent Labs Lab 01/29/14 1700 01/29/14 1836 01/30/14 0802  NA 146 143 146  K 4.9 5.1 5.1  CL 102 98 100  CO2 34* 33* 38*  BUN 17 17 23   CREATININE 0.71 0.72 0.90  GLUCOSE 126* 130* 113*   Electrolytes  Recent Labs Lab 01/29/14 1700 01/29/14 1836 01/30/14 0802  CALCIUM 9.4 9.5 9.4  MG 2.0 2.0 2.2   Sepsis Markers  Recent Labs Lab 01/29/14 1038  LATICACIDVEN 0.98   ABG  Recent Labs Lab 01/29/14 1035 01/29/14 1253 01/29/14 1445  PHART 7.270* 7.244* 7.234*  PCO2ART 83.9* 89.5* 90.0*  PO2ART 80.0 89.0 76.7*   Liver Enzymes  Recent Labs Lab  01/29/14 1136  AST 36  ALT 47*  ALKPHOS 88  BILITOT 0.2*  ALBUMIN 3.2*   Cardiac Enzymes  Recent Labs Lab 01/29/14 1136 01/29/14 1700 01/29/14 2317  TROPONINI <0.30 <0.30 <0.30  PROBNP 1011.0*  --   --    Glucose  Recent Labs Lab 01/30/14 0739  GLUCAP 118*    Imaging Dg Chest Port 1 View  01/29/2014   CLINICAL DATA:  Shortness of breath  EXAM: PORTABLE CHEST - 1 VIEW  COMPARISON:  DG CHEST 2V dated 05/27/2012  FINDINGS: Lung bases are partly omitted from the field of view. Moderate enlargement of the cardiac silhouette is reidentified with central vascular congestion. Allowing for technique, no focal consolidation. No pleural effusion.  IMPRESSION: Cardiomegaly with central vascular congestion. If symptoms persist, consider PA and lateral chest radiographs obtained at full inspiration when the patient is clinically able.   Electronically Signed   By: Conchita Paris M.D.   On: 01/29/2014 10:30   I reviewed CXR from 4/17  ASSESSMENT / PLAN:  PULMONARY A: - Acute hypercarbic respiratory failure, likely due to combined CHF exacerbation and COPD exacerbation, CHF exacerbation dominates - Hx of COPD and asthma, has not been compliant to home medications. Currently only takes albuterol Inhaler when necessary  - No infiltration on chest x-ray but looks fluid overloaded -Trending more somnolent. HCO3 up -diuresis/ renal compensation for resp acidosis P:   - BiPAP - Scheduled nebulizers q2h - doxycyline oral  -Solu-Medrol 80 mg x1, followed by prednisone oral - Pending respiratory virus panel and Flu PCR - Active stimulation to improve ventilation. Otherwise may need intubation  CARDIOVASCULAR A:  -hx of HTN, not on medications -Acute congestive heart failure exacerbation (unknown LVEF) chest x-ray is consistent with vascular congestion. Pro BNP slightly up 1101 . P:  - Lasix 80 mg bid - BMP/Mg bid - Pending 2 D echo  RENAL A:  No acute issue, electrolytes normal on  admission  P:   F/u BMP for renal and electrolyte  GASTROINTESTINAL A:   - No acute issue P:   - monitor closely  HEMATOLOGIC A:  No acute issue P:  F/u CBC  INFECTIOUS A:  - COPD exacerbation, productive cough with greenish colored sputum, but no fever or chills, no leukocytosis, no infiltration on chest x-ray  P:   - Empiric coverage with oral doxycycline for 5 days  ENDOCRINE A:   -No hx of diabetes mellitus, and thyroid dysfunction   P:   - monitor CBC - TSH pending  NEUROLOGIC A:  No acute issues - patient's UDS positive for opioids and benzodiazepine  P:   - Monitor closely for withdraw    TODAY'S SUMMARY: She doesn't need intubation right now, but she is  high risk for intubation.  She needs: - aggressive diuresis - close monitoring by nursing/housestaff - if she becomes more somnolent, and/or less responsive then it would be reasonable to repeat an ABG at that time.  For now, would watch carefully (frequent neuro exams) and continue current care -Stimulate for better alertness and deeper breaths -Follow BMET, BNP, CXR    CD Annamaria Boots, MD [place] [facility]  If no response, call [phone]  01/30/2014, 9:06 AM

## 2014-01-31 ENCOUNTER — Inpatient Hospital Stay (HOSPITAL_COMMUNITY): Payer: Medicare Other

## 2014-01-31 ENCOUNTER — Encounter (HOSPITAL_COMMUNITY): Payer: Self-pay | Admitting: Radiology

## 2014-01-31 DIAGNOSIS — J441 Chronic obstructive pulmonary disease with (acute) exacerbation: Secondary | ICD-10-CM | POA: Diagnosis not present

## 2014-01-31 DIAGNOSIS — F1390 Sedative, hypnotic, or anxiolytic use, unspecified, uncomplicated: Secondary | ICD-10-CM | POA: Diagnosis present

## 2014-01-31 DIAGNOSIS — R0609 Other forms of dyspnea: Secondary | ICD-10-CM | POA: Diagnosis not present

## 2014-01-31 DIAGNOSIS — F1310 Sedative, hypnotic or anxiolytic abuse, uncomplicated: Secondary | ICD-10-CM

## 2014-01-31 DIAGNOSIS — L02219 Cutaneous abscess of trunk, unspecified: Secondary | ICD-10-CM

## 2014-01-31 DIAGNOSIS — K7689 Other specified diseases of liver: Secondary | ICD-10-CM | POA: Diagnosis not present

## 2014-01-31 DIAGNOSIS — I509 Heart failure, unspecified: Secondary | ICD-10-CM | POA: Diagnosis not present

## 2014-01-31 DIAGNOSIS — J9600 Acute respiratory failure, unspecified whether with hypoxia or hypercapnia: Secondary | ICD-10-CM | POA: Diagnosis not present

## 2014-01-31 DIAGNOSIS — F17200 Nicotine dependence, unspecified, uncomplicated: Secondary | ICD-10-CM

## 2014-01-31 DIAGNOSIS — L03319 Cellulitis of trunk, unspecified: Secondary | ICD-10-CM

## 2014-01-31 LAB — BASIC METABOLIC PANEL
BUN: 29 mg/dL — ABNORMAL HIGH (ref 6–23)
BUN: 26 mg/dL — ABNORMAL HIGH (ref 6–23)
CALCIUM: 10 mg/dL (ref 8.4–10.5)
CHLORIDE: 90 meq/L — AB (ref 96–112)
CO2: 40 meq/L — AB (ref 19–32)
CO2: 43 meq/L — AB (ref 19–32)
CREATININE: 0.86 mg/dL (ref 0.50–1.10)
CREATININE: 0.82 mg/dL (ref 0.50–1.10)
Calcium: 9.6 mg/dL (ref 8.4–10.5)
Chloride: 98 mEq/L (ref 96–112)
GFR calc Af Amer: 86 mL/min — ABNORMAL LOW (ref >90)
GFR calc Af Amer: >90 mL/min (ref >90)
GFR calc non Af Amer: 74 mL/min — ABNORMAL LOW (ref >90)
GFR calc non Af Amer: 79 mL/min — ABNORMAL LOW (ref >90)
GLUCOSE: 111 mg/dL — AB (ref 70–99)
GLUCOSE: 134 mg/dL — AB (ref 70–99)
Potassium: 4.5 mEq/L (ref 3.7–5.3)
Potassium: 4.4 mEq/L (ref 3.7–5.3)
Sodium: 146 mEq/L (ref 137–147)
Sodium: 142 mEq/L (ref 137–147)

## 2014-01-31 LAB — MAGNESIUM
Magnesium: 2.2 mg/dL (ref 1.5–2.5)
Magnesium: 2.1 mg/dL (ref 1.5–2.5)

## 2014-01-31 LAB — GLUCOSE, CAPILLARY: GLUCOSE-CAPILLARY: 103 mg/dL — AB (ref 70–99)

## 2014-01-31 MED ORDER — IOHEXOL 300 MG/ML  SOLN
100.0000 mL | Freq: Once | INTRAMUSCULAR | Status: AC | PRN
  Administered 2014-01-31: 100 mL via INTRAVENOUS

## 2014-01-31 MED ORDER — PREDNISONE 20 MG PO TABS
40.0000 mg | ORAL_TABLET | Freq: Every day | ORAL | Status: DC
  Filled 2014-01-31: qty 2

## 2014-01-31 MED ORDER — PREDNISONE 20 MG PO TABS
40.0000 mg | ORAL_TABLET | Freq: Every day | ORAL | Status: DC
  Administered 2014-01-31 – 2014-02-03 (×4): 40 mg via ORAL
  Filled 2014-01-31 (×5): qty 2

## 2014-01-31 MED ORDER — DIPHENHYDRAMINE HCL 25 MG PO CAPS
25.0000 mg | ORAL_CAPSULE | Freq: Once | ORAL | Status: AC
  Administered 2014-01-31: 25 mg via ORAL
  Filled 2014-01-31: qty 1

## 2014-01-31 NOTE — Progress Notes (Signed)
PULMONARY / CRITICAL CARE MEDICINE   Name: Ellen White MRN: [id number] DOB: [birth year]    ADMISSION DATE:  01/29/2014 CONSULTATION DATE:  4/17  REFERRING MD :  Dr. Murlean Caller PRIMARY SERVICE: IMTS  CHIEF COMPLAINT:  SOB  BRIEF PATIENT DESCRIPTION: Ellen White is a 58 yo white female with a PMH of COPD, asthma, tobacco abuse,  hx of incarcerated hernia with repair in 2010, who presents with progressive shortness of breath, productive cough, bilateral leg edema. PCCM was consulted because of worsening respiratory status.  SIGNIFICANT EVENTS / STUDIES:  - 4/17 BiPAP>>>  LINES / TUBES: 4/17 PIV>>>  CULTURES: -  respiratory virus panel>>>   ANTIBIOTICS: - Doxycycline 4/17 >>>  Subjective:Awake and alert, denies dyspnea.. Wore ventimask last night instead of BIPAP  VITAL SIGNS: Temp:  [97.8 F (36.6 C)-98.4 F (36.9 C)] 98 F (36.7 C) (04/19 0255) Pulse Rate:  [79-93] 83 (04/19 0816) Resp:  [14-25] 14 (04/19 0816) BP: (95-147)/(48-115) 141/89 mmHg (04/19 0713) SpO2:  [90 %-97 %] 92 % (04/19 0816) FiO2 (%):  [45 %-60 %] 45 % (04/19 0800) Weight:  [291 lb 3.6 oz (132.1 kg)] 291 lb 3.6 oz (132.1 kg) (04/19 0255) HEMODYNAMICS:   VENTILATOR SETTINGS: Vent Mode:  [-]  FiO2 (%):  [45 %-60 %] 45 % INTAKE / OUTPUT: Intake/Output     04/18 0701 - 04/19 0700 04/19 0701 - 04/20 0700   P.O. 240 80   I.V. (mL/kg)  3 (0)   Total Intake(mL/kg) 240 (1.8) 83 (0.6)   Urine (mL/kg/hr) 4325 (1.4) 250 (0.7)   Total Output 4325 250   Net -4085 -167          PHYSICAL EXAMINATION: General: Awake, conversant Neuro:  Alert, oriented, appropriate. Using phone to speak to family HEENT: Neck supple, no JVD, no stridor Cardiovascular: S1/S2, regular rhythm and rate, no murmur  Lungs:  Difficult to ascultate given obesity, no wheezing or rales  Abdomen: Bowel sounds are normal. Has abdominal distension. Non tender.  Morbidly obese Musculoskeletal : no edema  Musculoskeletal: Moves all  extrems Skin:No rash on exposed skin  LABS:  CBC  Recent Labs Lab 01/29/14 1025 01/29/14 1038 01/30/14 0310  WBC 10.7*  --  9.9  HGB 14.6 16.7* 13.7  HCT 47.7* 49.0* 46.3*  PLT 284  --  296   Coag's No results found for this basename: APTT, INR,  in the last 168 hours BMET  Recent Labs Lab 01/30/14 0802 01/30/14 1829 01/31/14 0750  NA 146 145 146  K 5.1 4.4 4.5  CL 100 97 98  CO2 38* 40* 40*  BUN 23 25* 29*  CREATININE 0.90 0.91 0.86  GLUCOSE 113* 101* 111*   Electrolytes  Recent Labs Lab 01/30/14 0802 01/30/14 1829 01/31/14 0750  CALCIUM 9.4 9.6 9.6  MG 2.2 2.2 2.2   Sepsis Markers  Recent Labs Lab 01/29/14 1038  LATICACIDVEN 0.98   ABG  Recent Labs Lab 01/29/14 1035 01/29/14 1253 01/29/14 1445  PHART 7.270* 7.244* 7.234*  PCO2ART 83.9* 89.5* 90.0*  PO2ART 80.0 89.0 76.7*   Liver Enzymes  Recent Labs Lab 01/29/14 1136  AST 36  ALT 47*  ALKPHOS 88  BILITOT 0.2*  ALBUMIN 3.2*   Cardiac Enzymes  Recent Labs Lab 01/29/14 1136 01/29/14 1700 01/29/14 2317  TROPONINI <0.30 <0.30 <0.30  PROBNP 1011.0*  --   --    Glucose  Recent Labs Lab 01/30/14 0739 01/31/14 0818  GLUCAP 118* 103*    Imaging Dg Chest  Port 1 View  01/29/2014   CLINICAL DATA:  Shortness of breath  EXAM: PORTABLE CHEST - 1 VIEW  COMPARISON:  DG CHEST 2V dated 05/27/2012  FINDINGS: Lung bases are partly omitted from the field of view. Moderate enlargement of the cardiac silhouette is reidentified with central vascular congestion. Allowing for technique, no focal consolidation. No pleural effusion.  IMPRESSION: Cardiomegaly with central vascular congestion. If symptoms persist, consider PA and lateral chest radiographs obtained at full inspiration when the patient is clinically able.   Electronically Signed   By: Conchita Paris M.D.   On: 01/29/2014 10:30   I reviewed CXR from 4/17  ASSESSMENT / PLAN:  PULMONARY A: - Acute hypercarbic respiratory failure,  likely due to combined CHF exacerbation and COPD exacerbation, CHF exacerbation dominates - Hx of COPD and asthma, has not been compliant to home medications. Currently only takes albuterol Inhaler when necessary  - No infiltration on chest x-ray but looks fluid overloaded -Trending more somnolent. HCO3 up -diuresis/ renal compensation for resp acidosis Agree with progressing meds P:   - BiPAP - to resume as needed - prn nebulizers q2h, sched every 4 - doxycyline oral  -Solu-Medrol 80 mg x1, followed by prednisone oral - Pending respiratory virus panel and Flu PCR - Active stimulation to improve ventilation. Otherwise may need intubation  CARDIOVASCULAR A:  -hx of HTN, not on medications -Acute congestive heart failure exacerbation (unknown LVEF) chest x-ray is consistent with vascular congestion. Pro BNP slightly up 1101 . P:  - Lasix 80 mg bid - BMP/Mg trend -  2 D echo- done  RENAL A:  No acute issue, electrolytes normal on admission  P:   F/u BMP for renal and electrolyte  GASTROINTESTINAL A:   - No acute issue P:   - monitor closely  HEMATOLOGIC A:  No acute issue P:  F/u CBC  INFECTIOUS A:  - COPD exacerbation, productive cough with greenish colored sputum, but no fever or chills, no leukocytosis, no infiltration on chest x-ray  P:   - Empiric coverage with oral doxycycline for 5 days  ENDOCRINE A:   -No hx of diabetes mellitus, and thyroid dysfunction   P:   - monitor CBC - TSH pending  NEUROLOGIC A:  No acute issues - patient's UDS positive for opioids and benzodiazepine  P:   - Monitor closely for withdraw    TODAY'S SUMMARY:   CD Ellen Lefever, MD [facility] m [phone]  p- [phone] If no response, call [phone]  01/31/2014, 9:53 AM

## 2014-01-31 NOTE — Progress Notes (Signed)
  Date: 01/31/2014  Patient name: Ellen White  Medical record number: [id number]  Date of birth: 1956/02/07   This patient has been seen and the plan of care was discussed with the house staff. Please see their note for complete details. I concur with their findings with the following additions/corrections: Patient was talking on the phone as I walked in the room. She was concerned about the size of her abdomen.  On exam, ABD/GI: Soft, distended, +BS, erythema around lower abdomen is improved, there is pitting edema and a large mass-like structure in upper abdomen.  -Suspect ventral hernia. Lactic acid was normal on admission. I don't suspect strangulation, especially given the size. Would agree with CT abd/pelvis w contrast to further evaluate this, especially in setting of previous surgeries. -Continue to treat CHF. This is certainly right ventricular failure. Follow BMP, Mg as you are doing.  -Watch respiratory status, would place on bipap when sleeping, as i'm sure she has OSA/OHS.  -Appreciate neuro recs.  Dominic Pea, DO, [facility] 01/31/2014, 12:36 PM

## 2014-01-31 NOTE — Progress Notes (Signed)
Subjective: Ellen White was seen and examined at bedside this morning.  She was on venti-mask and claiming she did not feel well.  She endorses occasional discomfort with the foley, but says her breathing is much improved since admission.  She is concerned about her abdomen looking so big. She is hungry and asking for food today.   Objective: Vital signs in last 24 hours: Filed Vitals:   01/31/14 0403 01/31/14 0713 01/31/14 0800 01/31/14 0816  BP:  141/89    Pulse:  85  83  Temp:      TempSrc:      Resp:  18  14  Height:      Weight:      SpO2: 94% 92% 95% 92%   Weight change: -15 lb 1.4 oz (-6.843 kg)  Intake/Output Summary (Last 24 hours) at 01/31/14 7858 Last data filed at 01/31/14 0815  Gross per 24 hour  Intake    320 ml  Output   4125 ml  Net  -3805 ml   General: on venti-mask, transitioned to [place].  HEENT: EOMI Cardiac: RRR, distant heart sounds due to body habitus Pulm: difficult to auscultate given body habitus, but b/l anterior wheezing Abd: soft, mild tenderness to palpation upper abdomen, +distended, BS present, stretch marks, umbilical scar with stretch marks and improved erythema Ext: warm and well perfused, 1+ pitting edema up to knees, improved since admission Neuro: alert and oriented X3, talking and responding to questions appropriately, sensation in tact  Lab Results: Basic Metabolic Panel:  Recent Labs Lab 01/30/14 0802 01/30/14 1829  NA 146 145  K 5.1 4.4  CL 100 97  CO2 38* 40*  GLUCOSE 113* 101*  BUN 23 25*  CREATININE 0.90 0.91  CALCIUM 9.4 9.6  MG 2.2 2.2   Liver Function Tests:  Recent Labs Lab 01/29/14 1136  AST 36  ALT 47*  ALKPHOS 88  BILITOT 0.2*  PROT 7.1  ALBUMIN 3.2*   CBC:  Recent Labs Lab 01/29/14 1025 01/29/14 1038 01/30/14 0310  WBC 10.7*  --  9.9  NEUTROABS 7.9*  --   --   HGB 14.6 16.7* 13.7  HCT 47.7* 49.0* 46.3*  MCV 93.0  --  94.9  PLT 284  --  296   Cardiac Enzymes:  Recent Labs Lab 01/29/14 1136  01/29/14 1700 01/29/14 2317  TROPONINI <0.30 <0.30 <0.30   BNP:  Recent Labs Lab 01/29/14 1136  PROBNP 1011.0*   CBG:  Recent Labs Lab 01/30/14 0739  GLUCAP 118*   Hemoglobin A1C:  Recent Labs Lab 01/29/14 1545  HGBA1C 6.0*   Thyroid Function Tests:  Recent Labs Lab 01/29/14 1545  TSH 1.270   Urine Drug Screen: Drugs of Abuse     Component Value Date/Time   LABOPIA POSITIVE* 01/29/2014 1300   COCAINSCRNUR NONE DETECTED 01/29/2014 1300   LABBENZ POSITIVE* 01/29/2014 1300   AMPHETMU NONE DETECTED 01/29/2014 1300   THCU NONE DETECTED 01/29/2014 1300   LABBARB NONE DETECTED 01/29/2014 1300    Micro Results: Recent Results (from the past 240 hour(s))  MRSA PCR SCREENING     Status: None   Collection Time    01/29/14  2:43 PM      Result Value Ref Range Status   MRSA by PCR NEGATIVE  NEGATIVE Final   Comment:            The GeneXpert MRSA Assay (FDA     approved for NASAL specimens     only), is one  component of a     comprehensive MRSA colonization     surveillance program. It is not     intended to diagnose MRSA     infection nor to guide or     monitor treatment for     MRSA infections.  CULTURE, BLOOD (ROUTINE X 2)     Status: None   Collection Time    01/29/14  3:45 PM      Result Value Ref Range Status   Specimen Description BLOOD RIGHT HAND   Final   Special Requests BOTTLES DRAWN AEROBIC ONLY 10CC   Final   Culture  Setup Time     Final   Value: 01/29/2014 19:17     Performed at [facility]   Culture     Final   Value:        BLOOD CULTURE RECEIVED NO GROWTH TO DATE CULTURE WILL BE HELD FOR 5 DAYS BEFORE ISSUING A FINAL NEGATIVE REPORT     Performed at [facility]   Report Status PENDING   Incomplete  CULTURE, BLOOD (ROUTINE X 2)     Status: None   Collection Time    01/29/14  3:51 PM      Result Value Ref Range Status   Specimen Description BLOOD LEFT ARM   Final   Special Requests BOTTLES DRAWN AEROBIC AND ANAEROBIC 10CC    Final   Culture  Setup Time     Final   Value: 01/29/2014 19:16     Performed at [facility]   Culture     Final   Value:        BLOOD CULTURE RECEIVED NO GROWTH TO DATE CULTURE WILL BE HELD FOR 5 DAYS BEFORE ISSUING A FINAL NEGATIVE REPORT     Performed at [facility]   Report Status PENDING   Incomplete  RESPIRATORY VIRUS PANEL     Status: None   Collection Time    01/29/14  5:19 PM      Result Value Ref Range Status   Source - RVPAN NASOPHARYNGEAL   Final   Respiratory Syncytial Virus A NOT DETECTED   Final   Respiratory Syncytial Virus B NOT DETECTED   Final   Influenza A NOT DETECTED   Final   Influenza B NOT DETECTED   Final   Parainfluenza 1 NOT DETECTED   Final   Parainfluenza 2 NOT DETECTED   Final   Parainfluenza 3 NOT DETECTED   Final   Metapneumovirus NOT DETECTED   Final   Rhinovirus NOT DETECTED   Final   Adenovirus NOT DETECTED   Final   Influenza A H1 NOT DETECTED   Final   Influenza A H3 NOT DETECTED   Final   Comment: (NOTE)           Normal Reference Range for each Analyte: NOT DETECTED     Testing performed using the Luminex xTAG Respiratory Viral Panel test     kit.     This test was developed and its performance characteristics determined     by [facility]. It has not been cleared or approved by the [place]     Food and Drug Administration. This test is used for clinical purposes.     It should not be regarded as investigational or for research. This     laboratory is certified under the [place] (CLIA) as qualified to perform high complexity  clinical laboratory testing.     Performed at [facility]   Studies/Results: Dg Chest Port 1 View  01/29/2014   CLINICAL DATA:  Shortness of breath  EXAM: PORTABLE CHEST - 1 VIEW  COMPARISON:  DG CHEST 2V dated 05/27/2012  FINDINGS: Lung bases are partly omitted from the field of view. Moderate enlargement of the cardiac silhouette is  reidentified with central vascular congestion. Allowing for technique, no focal consolidation. No pleural effusion.  IMPRESSION: Cardiomegaly with central vascular congestion. If symptoms persist, consider PA and lateral chest radiographs obtained at full inspiration when the patient is clinically able.   Electronically Signed   By: Conchita Paris M.D.   On: 01/29/2014 10:30   Medications: I have reviewed the patient's current medications. Scheduled Meds: . cholecalciferol  2,000 Units Oral BID  . doxycycline  100 mg Oral Q12H  . enoxaparin (LOVENOX) injection  0.5 mg/kg Subcutaneous Q24H  . furosemide  80 mg Intravenous TID  . ipratropium-albuterol  3 mL Nebulization Q4H  . [START ON 02/01/2014] predniSONE  40 mg Oral Q breakfast  . sodium chloride  3 mL Intravenous Q12H   Continuous Infusions:  PRN Meds:.acetaminophen, acetaminophen, albuterol, ibuprofen, loratadine, ondansetron (ZOFRAN) IV, ondansetron Assessment/Plan: Acute respiratory failure with hypercapnia--Multifactorial, primarily 2/2 acute congestive heart failure but compounded with chronic hypercapnia from COPD in exacerbation and likely obesity hypoventilation syndrome as well. - Aggressive diuresis as below, down to 291lb's today, down 15 pounds since admission - was on venti-mask, will try on [place] 5L for now due to patient feeling very uncomfortable with mask, o2 sats 88-92% on [place] for now. Low threshold to transition back to venti-mask. BiPAP PRN if needed.  -Neuro checks Q4, stimulate for better alertness and deeper breathing please! Repositioned her in bed this morning. Will add flutter valve - ABG if more somnolent - Oral prednisone  Acute congestive heart failure  - Lasix IV 84m TID - Daily weights  - Foley in place but size may need to be adjusted given current discomfort, RN does note occasional leak with spasm. Strict I&O out -5.6L since admission so far  - TTE done yesterday, normal systolic function, EF 653-20%  moderate LVH, no wall motion abnormalities, REDUCED SYSTOLIC FUNCTION IN RV, moderately to severely dilated. PA peak pressure 368mg - Monitor electrolytes with BMP BID  COPD exacerbation  -empiric Doxycycline--day 2 -Duoneb q4 - Bipap to maintain O2 saturation >92%  Cellulitis of abdomen at site of prior incarcerated hernia repair. Improved erythema today. -Empiric doxycycline -Will need to image abdomen given severe distension and discomfort.   Morbid Obesity  - Likely contributory to respiratory status, will try to avoid intubation if possible. Mental status okay at this time.  - Will need PT when able  Impaired fasting glucose -A1c 6.0  Diet: Heart Healthy DVT Ppx: Lovenox Dispo: Disposition is deferred at this time, awaiting improvement of current medical problems.    The patient does have a current PCP (No Pcp Per Patient) and [facility].  The patient does not have transportation limitations that hinder transportation to clinic appointments.  Services Needed at time of discharge: Y = Yes, Blank = No PT:   OT:   RN:   Equipment:   Other:     LOS: 2 days   SaJerene PitchMD 01/31/2014, 8:22 AM

## 2014-02-01 DIAGNOSIS — E876 Hypokalemia: Secondary | ICD-10-CM

## 2014-02-01 DIAGNOSIS — I509 Heart failure, unspecified: Secondary | ICD-10-CM | POA: Diagnosis not present

## 2014-02-01 DIAGNOSIS — I5021 Acute systolic (congestive) heart failure: Secondary | ICD-10-CM | POA: Diagnosis not present

## 2014-02-01 DIAGNOSIS — J441 Chronic obstructive pulmonary disease with (acute) exacerbation: Secondary | ICD-10-CM | POA: Diagnosis not present

## 2014-02-01 DIAGNOSIS — K7689 Other specified diseases of liver: Secondary | ICD-10-CM

## 2014-02-01 DIAGNOSIS — L0390 Cellulitis, unspecified: Secondary | ICD-10-CM

## 2014-02-01 DIAGNOSIS — J9600 Acute respiratory failure, unspecified whether with hypoxia or hypercapnia: Secondary | ICD-10-CM | POA: Diagnosis not present

## 2014-02-01 DIAGNOSIS — D259 Leiomyoma of uterus, unspecified: Secondary | ICD-10-CM

## 2014-02-01 DIAGNOSIS — L0291 Cutaneous abscess, unspecified: Secondary | ICD-10-CM

## 2014-02-01 LAB — BASIC METABOLIC PANEL
BUN: 21 mg/dL (ref 6–23)
BUN: 22 mg/dL (ref 6–23)
CO2: 45 mEq/L (ref 19–32)
CO2: 45 mEq/L (ref 19–32)
Calcium: 10 mg/dL (ref 8.4–10.5)
Calcium: 10.5 mg/dL (ref 8.4–10.5)
Chloride: 91 mEq/L — ABNORMAL LOW (ref 96–112)
Chloride: 90 mEq/L — ABNORMAL LOW (ref 96–112)
Creatinine, Ser: 0.74 mg/dL (ref 0.50–1.10)
Creatinine, Ser: 0.74 mg/dL (ref 0.50–1.10)
GFR calc Af Amer: >90 mL/min (ref >90)
GLUCOSE: 116 mg/dL — AB (ref 70–99)
Glucose, Bld: 98 mg/dL (ref 70–99)
Potassium: 3.6 mEq/L — ABNORMAL LOW (ref 3.7–5.3)
Potassium: 3.8 mEq/L (ref 3.7–5.3)
SODIUM: 145 meq/L (ref 137–147)
SODIUM: 144 meq/L (ref 137–147)

## 2014-02-01 LAB — MAGNESIUM
MAGNESIUM: 2.2 mg/dL (ref 1.5–2.5)
MAGNESIUM: 2.3 mg/dL (ref 1.5–2.5)

## 2014-02-01 LAB — GLUCOSE, CAPILLARY: GLUCOSE-CAPILLARY: 118 mg/dL — AB (ref 70–99)

## 2014-02-01 MED ORDER — NICOTINE 21 MG/24HR TD PT24
21.0000 mg | MEDICATED_PATCH | Freq: Every day | TRANSDERMAL | Status: DC
  Administered 2014-02-01 – 2014-02-04 (×4): 21 mg via TRANSDERMAL
  Filled 2014-02-01 (×4): qty 1

## 2014-02-01 MED ORDER — ZOLPIDEM TARTRATE 5 MG PO TABS
5.0000 mg | ORAL_TABLET | Freq: Once | ORAL | Status: AC
  Administered 2014-02-01: 5 mg via ORAL
  Filled 2014-02-01: qty 1

## 2014-02-01 MED ORDER — IBUPROFEN 600 MG PO TABS
600.0000 mg | ORAL_TABLET | Freq: Four times a day (QID) | ORAL | Status: AC | PRN
  Administered 2014-02-01 – 2014-02-02 (×2): 600 mg via ORAL
  Filled 2014-02-01 (×2): qty 1

## 2014-02-01 MED ORDER — SENNA 8.6 MG PO TABS
1.0000 | ORAL_TABLET | Freq: Every evening | ORAL | Status: DC | PRN
  Administered 2014-02-01: 8.6 mg via ORAL
  Filled 2014-02-01: qty 1

## 2014-02-01 MED ORDER — POTASSIUM CHLORIDE CRYS ER 20 MEQ PO TBCR
20.0000 meq | EXTENDED_RELEASE_TABLET | Freq: Once | ORAL | Status: AC
  Administered 2014-02-01: 20 meq via ORAL
  Filled 2014-02-01: qty 1

## 2014-02-01 MED ORDER — POTASSIUM CHLORIDE CRYS ER 20 MEQ PO TBCR
40.0000 meq | EXTENDED_RELEASE_TABLET | Freq: Once | ORAL | Status: AC
  Administered 2014-02-01: 40 meq via ORAL
  Filled 2014-02-01: qty 2

## 2014-02-01 NOTE — Progress Notes (Signed)
  Date: 02/01/2014  Patient name: Ellen White  Medical record number: [id number]  Date of birth: 1955/12/06   This patient has been seen and the plan of care was discussed with the house staff. Please see their note for complete details. I concur with their findings with the following additions/corrections: Appears clinically improved. Agree she needs more diuresis. Discuss with pulmonary BiPAP use at night, especially since she very likely has OHS. Suspect increasing HCO3 due to diuresis, but again respiratory compensation is possibility. To tell the difference, would need ABG. Abdominal CT shows evidence of large uterine fibroid and a lobulated cyst in liver. She will need outpatient follow up. No evidence of bowel incarceration. Agree with course of empiric doxy given cellulitis that is improving and AECOPD.  Dominic Pea, DO, [facility] 02/01/2014, 11:37 AM

## 2014-02-01 NOTE — Progress Notes (Signed)
Subjective: Very talkative this morning.  Reports she is feeling better and notes that she feels less swollen. Objective: Vital signs in last 24 hours: Filed Vitals:   02/01/14 0347 02/01/14 0649 02/01/14 0732 02/01/14 0754  BP:    142/77  Pulse:    85  Temp:    98.4 F (36.9 C)  TempSrc:    Axillary  Resp:    12  Height:      Weight:      SpO2: 92% 83% 94% 92%   Weight change: -3 lb 12 oz (-1.7 kg)  Intake/Output Summary (Last 24 hours) at 02/01/14 0855 Last data filed at 02/01/14 0754  Gross per 24 hour  Intake   1423 ml  Output   7225 ml  Net  -5802 ml   General: NAD on6L [place] speaking in long sentences HEENT: EOMI Cardiac: RRR, no rubs, murmurs or gallops Pulm: mild end expiratory wheezing Abd: soft, nontender, distended (but improved), BS present, cellulitis improved Ext: warm and well perfused, trace pedal edema Neuro: alert and oriented X3  Lab Results: Basic Metabolic Panel:  Recent Labs Lab 01/31/14 0750 01/31/14 1810  NA 146 142  K 4.5 4.4  CL 98 90*  CO2 40* 43*  GLUCOSE 111* 134*  BUN 29* 26*  CREATININE 0.86 0.82  CALCIUM 9.6 10.0  MG 2.2 2.1   Liver Function Tests:  Recent Labs Lab 01/29/14 1136  AST 36  ALT 47*  ALKPHOS 88  BILITOT 0.2*  PROT 7.1  ALBUMIN 3.2*   CBC:  Recent Labs Lab 01/29/14 1025 01/29/14 1038 01/30/14 0310  WBC 10.7*  --  9.9  NEUTROABS 7.9*  --   --   HGB 14.6 16.7* 13.7  HCT 47.7* 49.0* 46.3*  MCV 93.0  --  94.9  PLT 284  --  296   Cardiac Enzymes:  Recent Labs Lab 01/29/14 1136 01/29/14 1700 01/29/14 2317  TROPONINI <0.30 <0.30 <0.30   BNP:  Recent Labs Lab 01/29/14 1136  PROBNP 1011.0*   CBG:  Recent Labs Lab 01/30/14 0739 01/31/14 0818 02/01/14 0751  GLUCAP 118* 103* 118*   Hemoglobin A1C:  Recent Labs Lab 01/29/14 1545  HGBA1C 6.0*   Thyroid Function Tests:  Recent Labs Lab 01/29/14 1545  TSH 1.270   Urine Drug Screen: Drugs of Abuse     Component Value  Date/Time   LABOPIA POSITIVE* 01/29/2014 1300   COCAINSCRNUR NONE DETECTED 01/29/2014 1300   LABBENZ POSITIVE* 01/29/2014 1300   AMPHETMU NONE DETECTED 01/29/2014 1300   THCU NONE DETECTED 01/29/2014 1300   LABBARB NONE DETECTED 01/29/2014 1300     Micro Results: Recent Results (from the past 240 hour(s))  MRSA PCR SCREENING     Status: None   Collection Time    01/29/14  2:43 PM      Result Value Ref Range Status   MRSA by PCR NEGATIVE  NEGATIVE Final   Comment:            The GeneXpert MRSA Assay (FDA     approved for NASAL specimens     only), is one component of a     comprehensive MRSA colonization     surveillance program. It is not     intended to diagnose MRSA     infection nor to guide or     monitor treatment for     MRSA infections.  CULTURE, BLOOD (ROUTINE X 2)     Status: None   Collection Time  01/29/14  3:45 PM      Result Value Ref Range Status   Specimen Description BLOOD RIGHT HAND   Final   Special Requests BOTTLES DRAWN AEROBIC ONLY 10CC   Final   Culture  Setup Time     Final   Value: 01/29/2014 19:17     Performed at [facility]   Culture     Final   Value:        BLOOD CULTURE RECEIVED NO GROWTH TO DATE CULTURE WILL BE HELD FOR 5 DAYS BEFORE ISSUING A FINAL NEGATIVE REPORT     Performed at [facility]   Report Status PENDING   Incomplete  CULTURE, BLOOD (ROUTINE X 2)     Status: None   Collection Time    01/29/14  3:51 PM      Result Value Ref Range Status   Specimen Description BLOOD LEFT ARM   Final   Special Requests BOTTLES DRAWN AEROBIC AND ANAEROBIC 10CC   Final   Culture  Setup Time     Final   Value: 01/29/2014 19:16     Performed at [facility]   Culture     Final   Value:        BLOOD CULTURE RECEIVED NO GROWTH TO DATE CULTURE WILL BE HELD FOR 5 DAYS BEFORE ISSUING A FINAL NEGATIVE REPORT     Performed at [facility]   Report Status PENDING   Incomplete  RESPIRATORY VIRUS PANEL     Status: None    Collection Time    01/29/14  5:19 PM      Result Value Ref Range Status   Source - RVPAN NASOPHARYNGEAL   Final   Respiratory Syncytial Virus A NOT DETECTED   Final   Respiratory Syncytial Virus B NOT DETECTED   Final   Influenza A NOT DETECTED   Final   Influenza B NOT DETECTED   Final   Parainfluenza 1 NOT DETECTED   Final   Parainfluenza 2 NOT DETECTED   Final   Parainfluenza 3 NOT DETECTED   Final   Metapneumovirus NOT DETECTED   Final   Rhinovirus NOT DETECTED   Final   Adenovirus NOT DETECTED   Final   Influenza A H1 NOT DETECTED   Final   Influenza A H3 NOT DETECTED   Final   Comment: (NOTE)           Normal Reference Range for each Analyte: NOT DETECTED     Testing performed using the Luminex xTAG Respiratory Viral Panel test     kit.     This test was developed and its performance characteristics determined     by [facility]. It has not been cleared or approved by the [place]     Food and Drug Administration. This test is used for clinical purposes.     It should not be regarded as investigational or for research. This     laboratory is certified under the [place] (CLIA) as qualified to perform high complexity     clinical laboratory testing.     Performed at [facility]   Studies/Results: Ct Abdomen Pelvis W Contrast  02/01/2014   CLINICAL DATA:  Left upper quadrant mass  EXAM: CT ABDOMEN AND PELVIS WITH CONTRAST  TECHNIQUE: Multidetector CT imaging of the abdomen and pelvis was performed using the standard protocol following bolus administration of intravenous contrast.  CONTRAST:  181m OMNIPAQUE IOHEXOL 300 MG/ML  SOLN  COMPARISON:  MR L SPINE W/O dated 07/17/2012; CT ABDOMEN W/CM dated 10/07/2009; CT PELVIS W/CM dated 10/07/2009  FINDINGS: The is lobulated cyst in the left lobe of the liver has enlarged. This is of unknown significance. There is otherwise stable  Gallbladder, spleen, pancreas, adrenal glands are  within normal limits. Hypodensity in the posterior left kidney is stable. Right kidney is unremarkable  Status post ventral hernia repair. Postoperative changes are noted. No evidence of recurrent hernia. There is stranding in the subcutaneous fat likely due to postoperative scarring.  Foley catheter decompresses the bladder. A large fundal uterine fibroid is suspected measuring 8.1 x 10.9 cm.  There is anterolisthesis L5 upon S1 without pars defects.  Abdominal aorta is ectatic with a maximal diameter of 3.1 cm. Atherosclerotic changes are noted.  Bibasilar atelectasis versus airspace disease at the lung bases.  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 There is advanced facet arthropathy in the lower lumbar spine.  IMPRESSION: Status post ventral hernia repair. No evidence of recurrent hernia or bowel obstruction.  Lobulated cyst in the liver has enlarged. This is of unknown significance. It has a benign appearance.  Large fundal uterine fibroid is suspected. This can be confirmed with ultrasound.   Electronically Signed   By: AMaryclare BeanM.D.   On: 02/01/2014 01:06   Medications: I have reviewed the patient's current medications. Scheduled Meds: . cholecalciferol  2,000 Units Oral BID  . doxycycline  100 mg Oral Q12H  . enoxaparin (LOVENOX) injection  0.5 mg/kg Subcutaneous Q24H  . furosemide  80 mg Intravenous TID  . ipratropium-albuterol  3 mL Nebulization Q4H  . predniSONE  40 mg Oral Q breakfast  . sodium chloride  3 mL Intravenous Q12H   Continuous Infusions:  PRN Meds:.acetaminophen, acetaminophen, albuterol, loratadine, ondansetron (ZOFRAN) IV, ondansetron Assessment/Plan: Acute respiratory failure with hypercapnia  - Multifactorial, primary Acute congestive heart failure but compounded with chronic hypercapnia from COPD, possible COPD exacerbation, and likely obesity hypoventilation syndrome. - Aggressive diuresis as below - BiPAP PRN, maintain O2 Sat >92% - CPAP overnight as below - steriods/  antibiotics as below - Bicarb up to 45 today, likely due to compensation  Acute systolic congestive heart failure - Right ventricular systolic dysfunction, EF 602-72%- Patient weight down 18 lbs, documented net I&O -11.5 L - Lasix IV 839mTID - Daily weights  - Strict I&O  - Monitor electrolytes with BMP and Mag BID - Bicarb trending up some but creatinine normal do not suspect contraction alkalosis at this time.  Possible OSA and obesity hypoventilation syndrome - Given patient's obesity and nocturnal desaturation, I suspect OSA - Will do trial of CPAP tonight - If CPAP ineffective may need nocturnal BiPAP  COPD exacerbation  -empiric Doxycycline thru 4/22 - Prednisone 4050mDuoneb q4 - Bipap to maintain O2 saturation >92%  Hypokalemia -KDur 63m57m1.  Cellulitis - Greatly improved -Empiric doxycycline thru 4/22  Morbid Obesity  - Likely contributory to respiratory status, will try to avoid intubation if possible.  Impaired fasting glucose -A1c 6.0  Uterine fibroid -Seen on CT imagining, patient reports she has been told before she has a fibroid. This does not appear to be causing her any discomfort at this time. -Recommend outpatient followup  Lobulated cyst in the liver  - Increased size since previous CT. Recommend continued monitoring with U/S as outpatient  Dispo: Disposition is deferred at this time, awaiting improvement of current medical problems.  The patient does have a current PCP (No Pcp Per Patient) and does not need an [facility] hospital follow-up appointment after discharge.  The patient does not have transportation limitations that hinder transportation to clinic appointments.  .Services Needed at time of discharge: Y = Yes, Blank = No PT:   OT:   RN:   Equipment:   Other:     LOS: 3 days   Joni Reining, DO 02/01/2014, 8:55 AM

## 2014-02-01 NOTE — Clinical Documentation Improvement (Signed)
Please clarify type of CHF. Thank you.  Possible Clinical Conditions?  Acute Systolic Congestive Heart Failure Acute Diastolic Congestive Heart Failure Acute Systolic & Diastolic Congestive Heart Failure Acute on Chronic Systolic Congestive Heart Failure Acute on Chronic Diastolic Congestive Heart Failure Acute on Chronic Systolic & Diastolic Congestive Heart Failure Other Condition________________________________________ Cannot Clinically Determine  Supporting Information:  Risk Factors: H&P:  She has evidence of HF on exam, and I suspect RHF 4/18 & 4/19 progress notes: Acute congestive heart failure exacerbation (unknown LVEF) chest x-ray is consistent with vascular congestion History of Hypertension No history of CHF Morbid obesity  Signs & Symptoms: Chest pain Shortness of breath weakness 1-2 + pitting edema  Diagnostics: BNP:  1101 4/18 Echo: EF = 60-65% 4/17 CXR: IMPRESSION: Cardiomegaly with central vascular congestion  Treatment: IV Lasix 80mg  tid Daily weights Strict I&O Cardiac monitoring CXR BNP Echo  Thank You, Estella Husk ,RN Clinical Documentation Specialist:  [place] [facility]

## 2014-02-01 NOTE — Progress Notes (Signed)
Continue foley for strict intake and output and aggressive IV diuresis (80mg  lasix IV q8h)

## 2014-02-01 NOTE — Progress Notes (Signed)
PULMONARY / CRITICAL CARE MEDICINE   Name: Ellen White MRN: [id number] DOB: 1956-01-02    ADMISSION DATE:  01/29/2014 CONSULTATION DATE:  4/17  REFERRING MD :  Dr. Murlean Caller PRIMARY SERVICE: IMTS  CHIEF COMPLAINT:  SOB  BRIEF PATIENT DESCRIPTION: Ellen White is a 58 yo white female with a PMH of COPD, asthma, tobacco abuse,  hx of incarcerated hernia with repair in 2010, who presents with progressive shortness of breath, productive cough, bilateral leg edema. PCCM was consulted because of worsening respiratory status.  SIGNIFICANT EVENTS / STUDIES:  - 4/17 BiPAP>>>  LINES / TUBES: 4/17 PIV>>>  CULTURES: -  respiratory virus panel>>> NEGATIVE   ANTIBIOTICS: - Doxycycline 4/17 >>>  Subjective:  01/31/14: Awake and alert, denies dyspnea.. Wore ventimask last night instead of BIPAP  02/01/14: feeling better  VITAL SIGNS: Temp:  [97.6 F (36.4 C)-98.6 F (37 C)] 98.4 F (36.9 C) (04/20 0754) Pulse Rate:  [83-95] 85 (04/20 0754) Resp:  [11-32] 12 (04/20 0754) BP: (118-165)/(72-99) 142/77 mmHg (04/20 0754) SpO2:  [83 %-95 %] 92 % (04/20 0754) FiO2 (%):  [45 %] 45 % (04/20 0754) Weight:  [287 lb 7.7 oz (130.4 kg)] 287 lb 7.7 oz (130.4 kg) (04/20 0254) HEMODYNAMICS:   VENTILATOR SETTINGS: Vent Mode:  [-]  FiO2 (%):  [45 %] 45 % INTAKE / OUTPUT: Intake/Output     04/19 0701 - 04/20 0700 04/20 0701 - 04/21 0700   P.O. 1260 240   I.V. (mL/kg) 3 (0) 3 (0)   Total Intake(mL/kg) 1263 (9.7) 243 (1.9)   Urine (mL/kg/hr) 7300 (2.3) 1425 (2.9)   Total Output 7300 1425   Net -6037 -1182          PHYSICAL EXAMINATION: General: Awake, conversant Neuro:  Alert, oriented, appropriate. Using phone to speak to family HEENT: Neck supple, no JVD, no stridor Cardiovascular: S1/S2, regular rhythm and rate, no murmur  Lungs:  Difficult to ascultate given obesity, no distress but occ wheeze + Abdomen: Bowel sounds are normal. Has abdominal distension. Non tender.  Morbidly  obese Musculoskeletal : no edema  Musculoskeletal: Moves all extrems Skin:No rash on exposed skin  LABS:  PULMONARY  Recent Labs Lab 01/29/14 1035 01/29/14 1038 01/29/14 1253 01/29/14 1445  PHART 7.270*  --  7.244* 7.234*  PCO2ART 83.9*  --  89.5* 90.0*  PO2ART 80.0  --  89.0 76.7*  HCO3 38.6*  --  38.7* 36.7*  TCO2 41 37 41 39.4  O2SAT 93.0  --  94.0 93.9    CBC  Recent Labs Lab 01/29/14 1025 01/29/14 1038 01/30/14 0310  HGB 14.6 16.7* 13.7  HCT 47.7* 49.0* 46.3*  WBC 10.7*  --  9.9  PLT 284  --  296    COAGULATION No results found for this basename: INR,  in the last 168 hours  CARDIAC   Recent Labs Lab 01/29/14 1136 01/29/14 1700 01/29/14 2317  TROPONINI <0.30 <0.30 <0.30    Recent Labs Lab 01/29/14 1136  PROBNP 1011.0*     CHEMISTRY  Recent Labs Lab 01/30/14 0802 01/30/14 1829 01/31/14 0750 01/31/14 1810 02/01/14 0804  NA 146 145 146 142 145  K 5.1 4.4 4.5 4.4 3.6*  CL 100 97 98 90* 91*  CO2 38* 40* 40* 43* 45*  GLUCOSE 113* 101* 111* 134* 98  BUN 23 25* 29* 26* 21  CREATININE 0.90 0.91 0.86 0.82 0.74  CALCIUM 9.4 9.6 9.6 10.0 10.0  MG 2.2 2.2 2.2 2.1 2.2   Estimated  Creatinine Clearance: 96.8 ml/min (by C-G formula based on Cr of 0.74).   LIVER  Recent Labs Lab 01/29/14 1136  AST 36  ALT 47*  ALKPHOS 88  BILITOT 0.2*  PROT 7.1  ALBUMIN 3.2*     INFECTIOUS  Recent Labs Lab 01/29/14 1038  LATICACIDVEN 0.98     ENDOCRINE CBG (last 3)   Recent Labs  01/30/14 0739 01/31/14 0818 02/01/14 0751  GLUCAP 118* 103* 118*         IMAGING x48h  Ct Abdomen Pelvis W Contrast  02/01/2014   CLINICAL DATA:  Left upper quadrant mass  EXAM: CT ABDOMEN AND PELVIS WITH CONTRAST  TECHNIQUE: Multidetector CT imaging of the abdomen and pelvis was performed using the standard protocol following bolus administration of intravenous contrast.  CONTRAST:  129mL OMNIPAQUE IOHEXOL 300 MG/ML  SOLN  COMPARISON:  MR L SPINE W/O  dated 07/17/2012; CT ABDOMEN W/CM dated 10/07/2009; CT PELVIS W/CM dated 10/07/2009  FINDINGS: The is lobulated cyst in the left lobe of the liver has enlarged. This is of unknown significance. There is otherwise stable  Gallbladder, spleen, pancreas, adrenal glands are within normal limits. Hypodensity in the posterior left kidney is stable. Right kidney is unremarkable  Status post ventral hernia repair. Postoperative changes are noted. No evidence of recurrent hernia. There is stranding in the subcutaneous fat likely due to postoperative scarring.  Foley catheter decompresses the bladder. A large fundal uterine fibroid is suspected measuring 8.1 x 10.9 cm.  There is anterolisthesis L5 upon S1 without pars defects.  Abdominal aorta is ectatic with a maximal diameter of 3.1 cm. Atherosclerotic changes are noted.  Bibasilar atelectasis versus airspace disease at the lung bases.  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 There is advanced facet arthropathy in the lower lumbar spine.  IMPRESSION: Status post ventral hernia repair. No evidence of recurrent hernia or bowel obstruction.  Lobulated cyst in the liver has enlarged. This is of unknown significance. It has a benign appearance.  Large fundal uterine fibroid is suspected. This can be confirmed with ultrasound.   Electronically Signed   By: Maryclare Bean M.D.   On: 02/01/2014 01:06      ASSESSMENT / PLAN:  PULMONARY A: - Acute hypercarbic respiratory failure, likely due to combined CHF exacerbation and COPD exacerbation, CHF exacerbation dominates - Hx of COPD and asthma, has not been compliant to home medications. Currently only takes albuterol Inhaler when necessary  - No infiltration on chest x-ray but looks fluid overloaded -Trending more somnolent. HCO3 up -diuresis/ renal compensation for resp acidosis Agree with progressing meds   - improved but still with some wheeze   P:   - BiPAP - to resume as needed - prn nebulizers q2h, sched every 4 -  doxycyline oral - prednisone oral - Active stimulation to improve ventilation. Otherwise may need intubation - recheck abg  CARDIOVASCULAR A:  -hx of HTN, not on medications -Acute congestive diastolic heart failure exacerbation (unknown LVEF) chest x-ray is consistent with vascular congestion. Pro BNP slightly up 1101 . P:  - Lasix 80 mg bid - BMP/Mg trend -  Recheck bnp  RENAL A:  No acute issue, electrolytes normal on admission  P:   F/u BMP for renal and electrolyte  GASTROINTESTINAL A:   - No acute issue P:   - monitor closely  HEMATOLOGIC A:  No acute issue P:  F/u CBC  INFECTIOUS A:  - COPD exacerbation, productive cough with greenish colored sputum, but no fever  or chills, no leukocytosis, no infiltration on chest x-ray  P:   - Empiric coverage with oral doxycycline for 5 days  ENDOCRINE A:   -No hx of diabetes mellitus, and thyroid dysfunction   P:   - monitor CBC - TSH pending  NEUROLOGIC A:  No acute issues - patient's UDS positive for opioids and benzodiazepine  P:   - Monitor closely for withdraw  GLOBAL Slowly better. REcheck abg 02/02/14 to check on status respiratory. Daughter updated   Dr. Brand Males, M.D., [facility].C.P Pulmonary and Critical Care Medicine Staff Physician [facility] Pager: [phone], If no answer or between  15:00h - 7:00h: call [phone]  02/01/2014 10:57 AM

## 2014-02-02 ENCOUNTER — Inpatient Hospital Stay (HOSPITAL_COMMUNITY): Payer: Medicare Other

## 2014-02-02 ENCOUNTER — Encounter (HOSPITAL_COMMUNITY): Payer: Self-pay | Admitting: Radiology

## 2014-02-02 DIAGNOSIS — J9600 Acute respiratory failure, unspecified whether with hypoxia or hypercapnia: Secondary | ICD-10-CM | POA: Diagnosis not present

## 2014-02-02 DIAGNOSIS — J441 Chronic obstructive pulmonary disease with (acute) exacerbation: Secondary | ICD-10-CM | POA: Diagnosis not present

## 2014-02-02 DIAGNOSIS — J90 Pleural effusion, not elsewhere classified: Secondary | ICD-10-CM | POA: Diagnosis not present

## 2014-02-02 DIAGNOSIS — I509 Heart failure, unspecified: Secondary | ICD-10-CM | POA: Diagnosis not present

## 2014-02-02 LAB — BLOOD GAS, ARTERIAL
Acid-Base Excess: 22.9 mmol/L — ABNORMAL HIGH (ref 0.0–2.0)
BICARBONATE: 49.6 meq/L — AB (ref 20.0–24.0)
Drawn by: 252031
O2 Content: 5 L/min
O2 SAT: 90.3 %
PO2 ART: 63.1 mmHg — AB (ref 80.0–100.0)
Patient temperature: 98.6
TCO2: 52.3 mmol/L (ref 0–100)
pCO2 arterial: 86.4 mmHg (ref 35.0–45.0)
pH, Arterial: 7.377 (ref 7.350–7.450)

## 2014-02-02 LAB — BASIC METABOLIC PANEL
BUN: 24 mg/dL — AB (ref 6–23)
BUN: 23 mg/dL (ref 6–23)
CHLORIDE: 89 meq/L — AB (ref 96–112)
CO2: 44 mEq/L (ref 19–32)
CO2: 45 meq/L — AB (ref 19–32)
Calcium: 10.3 mg/dL (ref 8.4–10.5)
Calcium: 10.6 mg/dL — ABNORMAL HIGH (ref 8.4–10.5)
Chloride: 87 mEq/L — ABNORMAL LOW (ref 96–112)
Creatinine, Ser: 0.94 mg/dL (ref 0.50–1.10)
Creatinine, Ser: 0.78 mg/dL (ref 0.50–1.10)
GFR calc Af Amer: >90 mL/min (ref >90)
GFR calc non Af Amer: 67 mL/min — ABNORMAL LOW (ref >90)
GFR, EST AFRICAN AMERICAN: 77 mL/min — AB (ref >90)
Glucose, Bld: 107 mg/dL — ABNORMAL HIGH (ref 70–99)
Glucose, Bld: 95 mg/dL (ref 70–99)
POTASSIUM: 3.8 meq/L (ref 3.7–5.3)
Potassium: 4.3 mEq/L (ref 3.7–5.3)
SODIUM: 139 meq/L (ref 137–147)
Sodium: 145 mEq/L (ref 137–147)

## 2014-02-02 LAB — CBC
HEMATOCRIT: 47.2 % — AB (ref 36.0–46.0)
Hemoglobin: 14.5 g/dL (ref 12.0–15.0)
MCH: 28.4 pg (ref 26.0–34.0)
MCHC: 30.7 g/dL (ref 30.0–36.0)
MCV: 92.5 fL (ref 78.0–100.0)
Platelets: 279 10*3/uL (ref 150–400)
RBC: 5.1 MIL/uL (ref 3.87–5.11)
RDW: 15.6 % — AB (ref 11.5–15.5)
WBC: 9.3 10*3/uL (ref 4.0–10.5)

## 2014-02-02 LAB — PHOSPHORUS: PHOSPHORUS: 3.9 mg/dL (ref 2.3–4.6)

## 2014-02-02 LAB — GLUCOSE, CAPILLARY: GLUCOSE-CAPILLARY: 107 mg/dL — AB (ref 70–99)

## 2014-02-02 LAB — D-DIMER, QUANTITATIVE: D-Dimer, Quant: 0.54 ug/mL-FEU — ABNORMAL HIGH (ref 0.00–0.48)

## 2014-02-02 LAB — PRO B NATRIURETIC PEPTIDE: Pro B Natriuretic peptide (BNP): 226.2 pg/mL — ABNORMAL HIGH (ref 0–125)

## 2014-02-02 MED ORDER — IOHEXOL 350 MG/ML SOLN: 70.0000 mL | Freq: Once | INTRAVENOUS | Status: AC | PRN

## 2014-02-02 MED ORDER — FUROSEMIDE 10 MG/ML IJ SOLN
80.0000 mg | Freq: Two times a day (BID) | INTRAMUSCULAR | Status: DC
  Administered 2014-02-02 – 2014-02-04 (×5): 80 mg via INTRAVENOUS
  Filled 2014-02-02 (×5): qty 8

## 2014-02-02 MED ORDER — POLYETHYLENE GLYCOL 3350 17 G PO PACK
17.0000 g | PACK | Freq: Every day | ORAL | Status: DC
  Administered 2014-02-02 – 2014-02-04 (×3): 17 g via ORAL
  Filled 2014-02-02 (×3): qty 1

## 2014-02-02 MED ORDER — PROMETHAZINE HCL 12.5 MG PO TABS
12.5000 mg | ORAL_TABLET | Freq: Once | ORAL | Status: AC
  Administered 2014-02-02: 12.5 mg via ORAL
  Filled 2014-02-02: qty 1

## 2014-02-02 MED ORDER — IOHEXOL 350 MG/ML SOLN
100.0000 mL | Freq: Once | INTRAVENOUS | Status: AC | PRN
  Administered 2014-02-02: 100 mL via INTRAVENOUS

## 2014-02-02 MED ORDER — POTASSIUM CHLORIDE CRYS ER 20 MEQ PO TBCR
20.0000 meq | EXTENDED_RELEASE_TABLET | Freq: Two times a day (BID) | ORAL | Status: DC
  Administered 2014-02-02 – 2014-02-03 (×3): 20 meq via ORAL
  Filled 2014-02-02 (×4): qty 1

## 2014-02-02 NOTE — Progress Notes (Signed)
  Date: 02/02/2014  Patient name: Ellen White  Medical record number: [id number]  Date of birth: Jun 26, 1956   This patient has been seen and the plan of care was discussed with the house staff. Please see their note for complete details. I concur with their findings with the following additions/corrections: Overnight was noted to have O2 desat and she did not want to use NIPPV. She is being aggressively diuresed and is showing evidence of a metabolic alkalosis, but I agree she still needs more diuresis. Given A-a gradient, I agree a PE is in DDx. Agree with D-dimer initially and if positive CTA of the cehst. Appreciate pulmonary consult.  Dominic Pea, DO, [facility] 02/02/2014, 12:02 PM

## 2014-02-02 NOTE — Progress Notes (Signed)
PT Cancellation Note  Patient Details Name: Ellen White MRN: [id number] DOB: [birth year]   Cancelled Treatment:    Reason Eval/Treat Not Completed: Medical issues which prohibited therapy (sent for CT scan to rule out PE). PT will re-attempt when able.    Manley Mason SPT 02/02/2014, 2:59 PM

## 2014-02-02 NOTE — Progress Notes (Signed)
CRITICAL VALUE ALERT  Critical value received:  ABG: pH 7.38, pCO2 87, pO2 63, bicarb 50, O2 sat 91%  Date of notification:  02/02/2014  Time of notification:  0500  Critical value read back:yes  Nurse who received alert:  D. Aleene Davidson, RN  MD notified (1st page):  Dr. Stann Mainland  Time of first page:  0515  MD notified (2nd page):  Time of second page:  Responding MD:  Dr. Stann Mainland  Time MD responded:  [phone]  No new orders at this time.

## 2014-02-02 NOTE — Progress Notes (Signed)
PULMONARY / CRITICAL CARE MEDICINE   Name: Ellen White MRN: [id number] DOB: Sep 13, 1956    ADMISSION DATE:  01/29/2014 CONSULTATION DATE:  4/17 LOS 4 days   REFERRING MD :  Ellen White PRIMARY SERVICE: IMTS  CHIEF COMPLAINT:  SOB  BRIEF PATIENT DESCRIPTION: Ellen White is a 58 y/o F with a PMH of COPD, asthma, tobacco abuse, incarcerated hernia with repair in 2010, who presents with progressive shortness of breath, productive cough, bilateral leg edema. PCCM was consulted because of worsening respiratory status.  SIGNIFICANT EVENTS / STUDIES:  4/17 - Admit with dyspnea requiring BiPAP 4/19 - Awake and alert, denies dyspnea.. Wore ventimask last night instead of BIPAP 4/21 - did not wear cpap last pm, no distress, periods of O2 dropping to 88-90% with activity   LINES / TUBES: 4/17 PIV>>>  CULTURES: RVP 417 >>> NEG   ANTIBIOTICS: Doxycycline 4/17 >>>  Subjective:  Pt reports she did not wear her CPAP last night.  No distress / acute events.  RN reports pt refuses to wear CPAP when offered. Desaturates easy.  She has been in bed since admission 01/29/2014    VITAL SIGNS: Temp:  [97.5 F (36.4 C)-98.6 F (37 C)] 97.9 F (36.6 C) (04/21 0711) Pulse Rate:  [86-94] 87 (04/21 0711) Resp:  [14-21] 19 (04/21 0711) BP: (135-165)/(69-95) 138/80 mmHg (04/21 0711) SpO2:  [81 %-95 %] 95 % (04/21 0804) FiO2 (%):  [40 %-55 %] 50 % (04/21 0804) Weight:  [279 lb (126.554 kg)] 279 lb (126.554 kg) (04/21 0424)  VENTILATOR SETTINGS: Vent Mode:  [-]  FiO2 (%):  [40 %-55 %] 50 %  INTAKE / OUTPUT: Intake/Output     04/20 0701 - 04/21 0700 04/21 0701 - 04/22 0700   P.O. 960    I.V. (mL/kg) 3 (0)    Total Intake(mL/kg) 963 (7.6)    Urine (mL/kg/hr) 5725 (1.9)    Total Output 5725     Net -4762            PHYSICAL EXAMINATION: General: Awake, conversant Neuro:  Alert, oriented, appropriate. HEENT: Neck supple, no JVD, no stridor Cardiovascular: S1/S2, regular rhythm and rate,  no murmur  Lungs:  Difficult to ascultate given obesity, clear anterior / lateral  Abdomen: Bowel sounds are normal. Has abdominal distension. Non tender.  Morbidly obese Musculoskeletal : no edema  Musculoskeletal: Moves all extremities  Skin: No rash on exposed skin  LABS:  PULMONARY  Recent Labs Lab 01/29/14 1035 01/29/14 1038 01/29/14 1253 01/29/14 1445 02/02/14 0445  PHART 7.270*  --  7.244* 7.234* 7.377  PCO2ART 83.9*  --  89.5* 90.0* 86.4*  PO2ART 80.0  --  89.0 76.7* 63.1*  HCO3 38.6*  --  38.7* 36.7* 49.6*  TCO2 41 37 41 39.4 52.3  O2SAT 93.0  --  94.0 93.9 90.3   CBC  Recent Labs Lab 01/29/14 1025 01/29/14 1038 01/30/14 0310 02/02/14 0835  HGB 14.6 16.7* 13.7 14.5  HCT 47.7* 49.0* 46.3* 47.2*  WBC 10.7*  --  9.9 9.3  PLT 284  --  296 279   CHEMISTRY  Recent Labs Lab 01/30/14 1829 01/31/14 0750 01/31/14 1810 02/01/14 0804 02/01/14 1810 02/02/14 0835  NA 145 146 142 145 144 145  K 4.4 4.5 4.4 3.6* 3.8 4.3  CL 97 98 90* 91* 90* 89*  CO2 40* 40* 43* 45* 45* 44*  GLUCOSE 101* 111* 134* 98 116* 107*  BUN 25* 29* 26* 21 22 24*  CREATININE 0.91 0.86  0.82 0.74 0.74 0.94  CALCIUM 9.6 9.6 10.0 10.0 10.5 10.3  MG 2.2 2.2 2.1 2.2 2.3  --   PHOS  --   --   --   --   --  3.9   Estimated Creatinine Clearance: 80.8 ml/min (by C-G formula based on Cr of 0.94).  ENDOCRINE CBG (last 3)   Recent Labs  01/31/14 0818 02/01/14 0751 02/02/14 0754  GLUCAP 103* 118* 107*     IMAGING x48h  Ct Abdomen Pelvis W Contrast  02/01/2014   CLINICAL DATA:  Left upper quadrant mass  EXAM: CT ABDOMEN AND PELVIS WITH CONTRAST  TECHNIQUE: Multidetector CT imaging of the abdomen and pelvis was performed using the standard protocol following bolus administration of intravenous contrast.  CONTRAST:  120mL OMNIPAQUE IOHEXOL 300 MG/ML  SOLN  COMPARISON:  MR L SPINE W/O dated 07/17/2012; CT ABDOMEN W/CM dated 10/07/2009; CT PELVIS W/CM dated 10/07/2009  FINDINGS: The is  lobulated cyst in the left lobe of the liver has enlarged. This is of unknown significance. There is otherwise stable  Gallbladder, spleen, pancreas, adrenal glands are within normal limits. Hypodensity in the posterior left kidney is stable. Right kidney is unremarkable  Status post ventral hernia repair. Postoperative changes are noted. No evidence of recurrent hernia. There is stranding in the subcutaneous fat likely due to postoperative scarring.  Foley catheter decompresses the bladder. A large fundal uterine fibroid is suspected measuring 8.1 x 10.9 cm.  There is anterolisthesis L5 upon S1 without pars defects.  Abdominal aorta is ectatic with a maximal diameter of 3.1 cm. Atherosclerotic changes are noted.  Bibasilar atelectasis versus airspace disease at the lung bases.  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 There is advanced facet arthropathy in the lower lumbar spine.  IMPRESSION: Status post ventral hernia repair. No evidence of recurrent hernia or bowel obstruction.  Lobulated cyst in the liver has enlarged. This is of unknown significance. It has a benign appearance.  Large fundal uterine fibroid is suspected. This can be confirmed with ultrasound.   Electronically Signed   By: Maryclare Bean M.D.   On: 02/01/2014 01:06      ASSESSMENT / PLAN:  PULMONARY A: Acute hypercarbic respiratory failure - likely due to combined CHF exacerbation and COPD exacerbation, CHF exacerbation dominates Hx of COPD and asthma - has not been compliant to home medications. Currently only takes albuterol Inhaler when necessary  Hypoxemia - ongoing, likely related to untreated OSA, presumed PAH  P:   - Autoset CPAP QHS & PRN Sleep  - prn nebulizers q2h, sched every 4 - doxycyline oral - prednisone oral - Active stimulation to improve ventilation.  - ambulate BID - out of bed TID - ABG only if change in mental status - will need ambulatory assessment of oxygen - if she ever requires intubation, early trach may  be appropriate given non-compliance with CPAP - needs outpatient sleep study - assess D-Dimer, if positive  Get CT angio CHESt (Staff MD note) - If PE  Negative, might be a canddiate for MARINER STUDY (low dose xarelto at dx v placebo x 45 days for dvt prevention); she and daughter appear interested  C  ARDIOVASCULAR A:  HTN -  Hx of, not on medications Acute congestive diastolic heart failure exacerbation - (unknown LVEF) chest x-ray is consistent with vascular congestion. Pro BNP slightly up 1101 . P:  - Lasix 80 mg bid - BMP/Mg trend - BNP trending down  RENAL A:  No acute issue,  electrolytes normal on admission  P:   F/u BMP for renal and electrolyte  GASTROINTESTINAL A:  No acute issue P:   - monitor closely  HEMATOLOGIC A:  No acute issue P:  F/u CBC  INFECTIOUS A:  COPD exacerbation - productive cough with greenish colored sputum, but no fever or chills, no leukocytosis, no infiltration on chest x-ray  P:   - Empiric coverage with oral doxycycline for 5 days  ENDOCRINE A:   No hx of diabetes mellitus, and thyroid dysfunction.  TSH 1.270 4/17 P:   - monitor CBC  NEUROLOGIC A:   Lethargy - in setting of hypercarbia, patient's UDS positive for opioids and benzodiazepine  P:   - Monitor closely for withdraw   PCCM will be available every few days  Please call if needs arise. (STaff MD note)   Ellen Gens, NP-C [facility] Pgr: [phone] or [phone]     02/02/2014 9:48 AM   STAFF NOTE  D/w resident, daughter and patient: will get d-dimer. If positive, will rule out PE. Discussed Mariner research protocol if PE negative: she appears interested    Ellen White, M.D., [facility].C.P Pulmonary and Critical Care Medicine Staff Physician [facility] Pager: [phone], If no answer or between  15:00h - 7:00h: call [phone]  02/02/2014 11:34 AM

## 2014-02-02 NOTE — Progress Notes (Signed)
PT Cancellation Note  Patient Details Name: Ellen White MRN: [id number] DOB: 07-18-1956   Cancelled Treatment:    Reason Eval/Treat Not Completed: Medical issues which prohibited therapy (sent for CT scan to rule out PE). PT will re-attempt when able.    Manley Mason SPT 02/02/2014, 2:59 PM  Kittie Plater, PT, DPT Pager #: [phone] Office #: [phone]

## 2014-02-02 NOTE — Progress Notes (Signed)
Subjective: Patient with overnight desaturations, was not placed on CPAP or BiPAP overnight as patient declined use. Otherwise patient reports she continues to feel better, she does note that she feels like her legs are weak from being in bed.  She denies any abdominal pain. Objective: Vital signs in last 24 hours: Filed Vitals:   02/02/14 0500 02/02/14 0520 02/02/14 0537 02/02/14 0615  BP:      Pulse: 93     Temp:      TempSrc:      Resp: 19     Height:      Weight:      SpO2: 88% 85% 81% 95%   Weight change: -8 lb 7.7 oz (-3.847 kg)  Intake/Output Summary (Last 24 hours) at 02/02/14 0714 Last data filed at 02/02/14 0530  Gross per 24 hour  Intake    963 ml  Output   5725 ml  Net  -4762 ml   General: on Venti mask HEENT: EOMI Cardiac: RRR, no rubs, murmurs or gallops Pulm: CTA no appreciated wheezing or rales Abd: soft, nontender, mildly distended, BS present, cellulitis resloved Ext: warm and well perfused, trace pedal edema Neuro: alert and oriented X3  Lab Results: Basic Metabolic Panel:  Recent Labs Lab 02/01/14 0804 02/01/14 1810  NA 145 144  K 3.6* 3.8  CL 91* 90*  CO2 45* 45*  GLUCOSE 98 116*  BUN 21 22  CREATININE 0.74 0.74  CALCIUM 10.0 10.5  MG 2.2 2.3   Liver Function Tests:  Recent Labs Lab 01/29/14 1136  AST 36  ALT 47*  ALKPHOS 88  BILITOT 0.2*  PROT 7.1  ALBUMIN 3.2*   CBC:  Recent Labs Lab 01/29/14 1025 01/29/14 1038 01/30/14 0310  WBC 10.7*  --  9.9  NEUTROABS 7.9*  --   --   HGB 14.6 16.7* 13.7  HCT 47.7* 49.0* 46.3*  MCV 93.0  --  94.9  PLT 284  --  296   Cardiac Enzymes:  Recent Labs Lab 01/29/14 1136 01/29/14 1700 01/29/14 2317  TROPONINI <0.30 <0.30 <0.30   BNP:  Recent Labs Lab 01/29/14 1136  PROBNP 1011.0*   CBG:  Recent Labs Lab 01/30/14 0739 01/31/14 0818 02/01/14 0751  GLUCAP 118* 103* 118*   Hemoglobin A1C:  Recent Labs Lab 01/29/14 1545  HGBA1C 6.0*   Thyroid Function  Tests:  Recent Labs Lab 01/29/14 1545  TSH 1.270   Urine Drug Screen: Drugs of Abuse     Component Value Date/Time   LABOPIA POSITIVE* 01/29/2014 1300   COCAINSCRNUR NONE DETECTED 01/29/2014 1300   LABBENZ POSITIVE* 01/29/2014 1300   AMPHETMU NONE DETECTED 01/29/2014 1300   THCU NONE DETECTED 01/29/2014 1300   LABBARB NONE DETECTED 01/29/2014 1300     Micro Results: Recent Results (from the past 240 hour(s))  MRSA PCR SCREENING     Status: None   Collection Time    01/29/14  2:43 PM      Result Value Ref Range Status   MRSA by PCR NEGATIVE  NEGATIVE Final   Comment:            The GeneXpert MRSA Assay (FDA     approved for NASAL specimens     only), is one component of a     comprehensive MRSA colonization     surveillance program. It is not     intended to diagnose MRSA     infection nor to guide or     monitor treatment for  MRSA infections.  CULTURE, BLOOD (ROUTINE X 2)     Status: None   Collection Time    01/29/14  3:45 PM      Result Value Ref Range Status   Specimen Description BLOOD RIGHT HAND   Final   Special Requests BOTTLES DRAWN AEROBIC ONLY 10CC   Final   Culture  Setup Time     Final   Value: 01/29/2014 19:17     Performed at [facility]   Culture     Final   Value:        BLOOD CULTURE RECEIVED NO GROWTH TO DATE CULTURE WILL BE HELD FOR 5 DAYS BEFORE ISSUING A FINAL NEGATIVE REPORT     Performed at [facility]   Report Status PENDING   Incomplete  CULTURE, BLOOD (ROUTINE X 2)     Status: None   Collection Time    01/29/14  3:51 PM      Result Value Ref Range Status   Specimen Description BLOOD LEFT ARM   Final   Special Requests BOTTLES DRAWN AEROBIC AND ANAEROBIC 10CC   Final   Culture  Setup Time     Final   Value: 01/29/2014 19:16     Performed at [facility]   Culture     Final   Value:        BLOOD CULTURE RECEIVED NO GROWTH TO DATE CULTURE WILL BE HELD FOR 5 DAYS BEFORE ISSUING A FINAL NEGATIVE REPORT      Performed at [facility]   Report Status PENDING   Incomplete  RESPIRATORY VIRUS PANEL     Status: None   Collection Time    01/29/14  5:19 PM      Result Value Ref Range Status   Source - RVPAN NASOPHARYNGEAL   Final   Respiratory Syncytial Virus A NOT DETECTED   Final   Respiratory Syncytial Virus B NOT DETECTED   Final   Influenza A NOT DETECTED   Final   Influenza B NOT DETECTED   Final   Parainfluenza 1 NOT DETECTED   Final   Parainfluenza 2 NOT DETECTED   Final   Parainfluenza 3 NOT DETECTED   Final   Metapneumovirus NOT DETECTED   Final   Rhinovirus NOT DETECTED   Final   Adenovirus NOT DETECTED   Final   Influenza A H1 NOT DETECTED   Final   Influenza A H3 NOT DETECTED   Final   Comment: (NOTE)           Normal Reference Range for each Analyte: NOT DETECTED     Testing performed using the Luminex xTAG Respiratory Viral Panel test     kit.     This test was developed and its performance characteristics determined     by [facility]. It has not been cleared or approved by the [place]     Food and Drug Administration. This test is used for clinical purposes.     It should not be regarded as investigational or for research. This     laboratory is certified under the [place] (CLIA) as qualified to perform high complexity     clinical laboratory testing.     Performed at [facility]   Studies/Results: Ct Abdomen Pelvis W Contrast  02/01/2014   CLINICAL DATA:  Left upper quadrant mass  EXAM: CT ABDOMEN AND PELVIS WITH CONTRAST  TECHNIQUE: Multidetector CT  imaging of the abdomen and pelvis was performed using the standard protocol following bolus administration of intravenous contrast.  CONTRAST:  171m OMNIPAQUE IOHEXOL 300 MG/ML  SOLN  COMPARISON:  MR L SPINE W/O dated 07/17/2012; CT ABDOMEN W/CM dated 10/07/2009; CT PELVIS W/CM dated 10/07/2009  FINDINGS: The is lobulated cyst in the left lobe of the liver has  enlarged. This is of unknown significance. There is otherwise stable  Gallbladder, spleen, pancreas, adrenal glands are within normal limits. Hypodensity in the posterior left kidney is stable. Right kidney is unremarkable  Status post ventral hernia repair. Postoperative changes are noted. No evidence of recurrent hernia. There is stranding in the subcutaneous fat likely due to postoperative scarring.  Foley catheter decompresses the bladder. A large fundal uterine fibroid is suspected measuring 8.1 x 10.9 cm.  There is anterolisthesis L5 upon S1 without pars defects.  Abdominal aorta is ectatic with a maximal diameter of 3.1 cm. Atherosclerotic changes are noted.  Bibasilar atelectasis versus airspace disease at the lung bases.  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 There is advanced facet arthropathy in the lower lumbar spine.  IMPRESSION: Status post ventral hernia repair. No evidence of recurrent hernia or bowel obstruction.  Lobulated cyst in the liver has enlarged. This is of unknown significance. It has a benign appearance.  Large fundal uterine fibroid is suspected. This can be confirmed with ultrasound.   Electronically Signed   By: AMaryclare BeanM.D.   On: 02/01/2014 01:06   Medications: I have reviewed the patient's current medications. Scheduled Meds: . cholecalciferol  2,000 Units Oral BID  . doxycycline  100 mg Oral Q12H  . enoxaparin (LOVENOX) injection  0.5 mg/kg Subcutaneous Q24H  . furosemide  80 mg Intravenous TID  . ipratropium-albuterol  3 mL Nebulization Q4H  . nicotine  21 mg Transdermal Daily  . predniSONE  40 mg Oral Q breakfast  . sodium chloride  3 mL Intravenous Q12H   Continuous Infusions:  PRN Meds:.albuterol, loratadine, ondansetron (ZOFRAN) IV, ondansetron, senna Assessment/Plan: Acute respiratory failure with hypercapnia  - Multifactorial, primary Acute congestive heart failure but compounded with chronic hypercapnia from COPD, possible COPD exacerbation, and likely  obesity hypoventilation syndrome. - Aggressive diuresis as below - BiPAP PRN, maintain O2 Sat >92% - CPAP overnight as below - steriods/ antibiotics as below - ABG today shows mixed respiratory acidosis and metabolic alkalosis, Met alk likely due to diuresis. Calculated Aa gradient elevated despite aggressive IV dieursis.  She does have CHF and bibasilar atelectasis but has not been ruled out for PE causing a V/Q mismatch.  I discussed this with Pulmonology.  Will obtain a D-dimer to rule out PE as cause of hypoxia.  If elevated will need CTA.  Acute systolic congestive heart failure - Right ventricular systolic dysfunction, EF 642-70%- Patient weight down 26 lbs, documented net I&O -16 L - Decrease to Lasix IV 843mBID - Daily weights  - Strict I&O  - Monitor electrolytes with BMP and Mag daily - Bicarb trending up some but creatinine normal possible mild contraction alkalosis at this time. - If able to transfer to chair with physical therapy will D/C foley  Possible OSA and obesity hypoventilation syndrome - Given patient's obesity and nocturnal desaturation, I suspect OSA - Will do trial of CPAP tonight discussed this with patient that she should not refuse.  Will avoid sleeping aids overnight for sleep. - If CPAP ineffective may need nocturnal BiPAP  COPD exacerbation  -empiric Doxycycline thru 4/22 -  Prednisone 1m thru 4/22 -Duoneb q4 - O2 to Bipap to maintain O2 saturation >92%  Hypokalemia -KDur 275m BID  Cellulitis - Greatly improved -Empiric doxycycline thru 4/22  Morbid Obesity  - Likely contributory to respiratory status  Impaired fasting glucose -A1c 6.0  Uterine fibroid -Seen on CT imagining, patient reports she has been told before she has a fibroid. This does not appear to be causing her any discomfort at this time. -Recommend outpatient followup  Lobulated cyst in the liver  - Increased size since previous CT. Recommend continued monitoring with U/S as  outpatient  Dispo: Disposition is deferred at this time, awaiting improvement of current medical problems.    The patient does have a current PCP (No Pcp Per Patient) and [facility] after [facility].  The patient does not have transportation limitations that hinder transportation to clinic appointments.  .Services Needed at time of discharge: Y = Yes, Blank = No PT:   OT:   RN:   Equipment:   Other:     LOS: 4 days   ErJoni ReiningDO 02/02/2014, 7:14 AM

## 2014-02-02 NOTE — Progress Notes (Signed)
Utilization review completed.  

## 2014-02-03 DIAGNOSIS — J9600 Acute respiratory failure, unspecified whether with hypoxia or hypercapnia: Secondary | ICD-10-CM | POA: Diagnosis not present

## 2014-02-03 DIAGNOSIS — J441 Chronic obstructive pulmonary disease with (acute) exacerbation: Secondary | ICD-10-CM | POA: Diagnosis not present

## 2014-02-03 DIAGNOSIS — I509 Heart failure, unspecified: Secondary | ICD-10-CM | POA: Diagnosis not present

## 2014-02-03 LAB — BASIC METABOLIC PANEL
BUN: 24 mg/dL — AB (ref 6–23)
BUN: 27 mg/dL — AB (ref 6–23)
CALCIUM: 10.7 mg/dL — AB (ref 8.4–10.5)
CO2: 41 meq/L — AB (ref 19–32)
CO2: 44 meq/L — AB (ref 19–32)
CREATININE: 0.82 mg/dL (ref 0.50–1.10)
CREATININE: 1.04 mg/dL (ref 0.50–1.10)
Calcium: 10.4 mg/dL (ref 8.4–10.5)
Chloride: 86 mEq/L — ABNORMAL LOW (ref 96–112)
Chloride: 88 mEq/L — ABNORMAL LOW (ref 96–112)
GFR calc Af Amer: >90 mL/min (ref >90)
GFR calc Af Amer: 68 mL/min — ABNORMAL LOW (ref >90)
GFR calc non Af Amer: 79 mL/min — ABNORMAL LOW (ref >90)
GFR calc non Af Amer: 59 mL/min — ABNORMAL LOW (ref >90)
GLUCOSE: 117 mg/dL — AB (ref 70–99)
Glucose, Bld: 126 mg/dL — ABNORMAL HIGH (ref 70–99)
Potassium: 4.9 mEq/L (ref 3.7–5.3)
Potassium: 4.8 mEq/L (ref 3.7–5.3)
Sodium: 140 mEq/L (ref 137–147)
Sodium: 142 mEq/L (ref 137–147)

## 2014-02-03 LAB — GLUCOSE, CAPILLARY: Glucose-Capillary: 114 mg/dL — ABNORMAL HIGH (ref 70–99)

## 2014-02-03 LAB — PHOSPHORUS: Phosphorus: 3.8 mg/dL (ref 2.3–4.6)

## 2014-02-03 LAB — MAGNESIUM: Magnesium: 2.2 mg/dL (ref 1.5–2.5)

## 2014-02-03 MED ORDER — ALBUTEROL SULFATE (2.5 MG/3ML) 0.083% IN NEBU: 5.0000 mg | INHALATION_SOLUTION | RESPIRATORY_TRACT | Status: DC | PRN

## 2014-02-03 MED ORDER — IPRATROPIUM-ALBUTEROL 0.5-2.5 (3) MG/3ML IN SOLN: 3.0000 mL | Freq: Three times a day (TID) | RESPIRATORY_TRACT | Status: DC

## 2014-02-03 MED ORDER — IBUPROFEN 600 MG PO TABS: 600.0000 mg | ORAL_TABLET | Freq: Two times a day (BID) | ORAL | Status: DC | PRN

## 2014-02-03 MED ORDER — IPRATROPIUM-ALBUTEROL 0.5-2.5 (3) MG/3ML IN SOLN
3.0000 mL | RESPIRATORY_TRACT | Status: DC
  Administered 2014-02-03 – 2014-02-04 (×5): 3 mL via RESPIRATORY_TRACT
  Filled 2014-02-03 (×5): qty 3

## 2014-02-03 MED ORDER — ACETAMINOPHEN 325 MG PO TABS
650.0000 mg | ORAL_TABLET | Freq: Four times a day (QID) | ORAL | Status: DC | PRN
  Administered 2014-02-03: 650 mg via ORAL
  Filled 2014-02-03: qty 2

## 2014-02-03 NOTE — Progress Notes (Signed)
Pt educated on why she had to wear her C-pap. Pt stated she felt nauseous. Pt was given medication to help with the nausea and instructed that she needed to wear the C-pap has long has she could tolerate. Pt agreeable to try. C-pap placed on pt. Will continue to monitor pt.

## 2014-02-03 NOTE — Evaluation (Signed)
Physical Therapy Evaluation Patient Details Name: AVNOOR KOURY MRN: [id number] DOB: Oct 11, 1956 Today's Date: 02/03/2014   History of Present Illness  I have seen and evaluated Rande Lawman and discussed their care with the Residency Team.  58 yr old woman with hx COPD, morbid obesity, hx incarcerated hernia repair in 2010, presented with SOB. One of her daughters stated her abdomen and neck have become more swollen recently. She has also developed abdominal erythema. She is supposed to be on some inhalers but has not been taking these as prescribed (?) and has not been consistently taking lasix. She was noted to be hypoxic in the ED in addition to having evidence of hypercarbia and thus placed on BiPAP.  Clinical Impression  Pt adm with the above dx. Pt required assistance from daughters for ADLs and amb with Rollator Walker PTA. Pt functional mobility was limited to fatigue and Bilat knee pain. Pt able to amb on this date. Pt reports that her daughters will be available to provide 24 hr assistance upon d/c. Pt to benefit from skilled acute PT to address limitations listed below.     Follow Up Recommendations Home health PT;Supervision/Assistance - 24 hour    Equipment Recommendations  None recommended by PT (pt has necessary equipment. may need to go home with O2)    Recommendations for Other Services       Precautions / Restrictions Precautions Precautions: Fall Precaution Comments: Bilat degenerative knees  Restrictions Weight Bearing Restrictions: No      Mobility  Bed Mobility Overal bed mobility: Needs Assistance Bed Mobility: Supine to Sit     Supine to sit: Min guard     General bed mobility comments: verbal cues for safety and sequencing getting to EOB  Transfers Overall transfer level: Needs assistance Equipment used: Rolling walker (2 wheeled);1 person hand held assist Transfers: Sit to/from [facility] Sit to Stand: Min assist Stand pivot  transfers: Min assist       General transfer comment: verbal cues for hand placement and step sequencing. pt req min A to transfer to [facility]. pt unable to use restroom but reports need to have a BM. min A to achieve upright standing position. pt req Bilat UE support in standing. pt tends to lean on RW with forearms. pt is about 4'11'' and RW was too high.  Ambulation/Gait Ambulation/Gait assistance: Min assist Ambulation Distance (Feet): 25 Feet Assistive device: Rolling walker (2 wheeled);1 person hand held assist Gait Pattern/deviations: Step-to pattern;Decreased stride length;Trunk flexed;Wide base of support Gait velocity: slow;guarded for pain   General Gait Details: pt req min A to maintain support and balance. pt demo'd toe out gait pattern with wide BOS. pt limited by fatigue and Bilat knee pain (R>L). pt tends to flex trunk and lean on RW with forearms. req verbal and tactile cues to correct posture.   Stairs            Wheelchair Mobility    Modified Rankin (Stroke Patients Only)       Balance Overall balance assessment: Needs assistance Sitting-balance support: No upper extremity supported;Feet supported Sitting balance-Leahy Scale: Fair Sitting balance - Comments: pt able to sit EOB for 5 min without UE support at supervision level   Standing balance support: Bilateral upper extremity supported Standing balance-Leahy Scale: Poor Standing balance comment: pt req Bilat UE support to maintain standing balance. pt demonstrated 30 sec of standing with RW at supervision level but req min A during functional movements such as marching  in place or pivoting.                              Pertinent Vitals/Pain Pt's O2 sats dropped to 90% during [facility] transfer and 89% during amb.  Pt reports Bilat knee pain during transfers and amb.     Home Living Family/patient expects to be discharged to:: Private residence Living Arrangements: Children Available Help at  Discharge: Family;Available 24 hours/day Type of Home: Mobile home Home Access: Stairs to enter Entrance Stairs-Rails: Can reach both Entrance Stairs-Number of Steps: 2 Home Layout: One level Home Equipment: Walker - 4 wheels;Tub bench;Bedside commode Additional Comments: pt reports complaint of tub seat not having a back.    Prior Function Level of Independence: Needs assistance   Gait / Transfers Assistance Needed: Rollator Walker allowed pt to sit and rest when needed.   ADL's / Homemaking Assistance Needed: needs assist with dressing with donning pants and socks and shoes. req assist for tub transfer. often sponge bathes. daughters prepare meals for her.   Comments: pt was not on O2 at home.      Hand Dominance        Extremity/Trunk Assessment   Upper Extremity Assessment: Overall WFL for tasks assessed           Lower Extremity Assessment: Generalized weakness         Communication   Communication: No difficulties  Cognition Arousal/Alertness: Awake/alert Behavior During Therapy: WFL for tasks assessed/performed Overall Cognitive Status: Within Functional Limits for tasks assessed                      General Comments General comments (skin integrity, edema, etc.): pt transferred to [facility] but was unable to have BM. Pt became dizzy going from supine to sit and again sit to stand. O2 sats dropped to 90 % during [facility] transfer. O2 sats dropped as low as 89% during amb while on 6 L O2 [place].     Exercises        Assessment/Plan    PT Assessment Patient needs continued PT services  PT Diagnosis Difficulty walking;Generalized weakness   PT Problem List Decreased strength;Decreased activity tolerance;Decreased mobility;Decreased balance;Decreased coordination;Decreased knowledge of use of DME;Decreased safety awareness  PT Treatment Interventions DME instruction;Gait training;Stair training;Functional mobility training;Therapeutic activities;Therapeutic  exercise;Balance training;Patient/family education   PT Goals (Current goals can be found in the Care Plan section) Acute Rehab PT Goals Patient Stated Goal: return home PT Goal Formulation: With patient Time For Goal Achievement: 02/10/14 Potential to Achieve Goals: Good    Frequency Min 3X/week   Barriers to discharge        Co-evaluation               End of Session Equipment Utilized During Treatment: Gait belt;Oxygen (6L O2 [place]; RW) Activity Tolerance: Patient limited by fatigue;Patient limited by pain Patient left: in chair;with call bell/phone within reach Nurse Communication: Mobility status         Time: 2951-8841 PT Time Calculation (min): 38 min   Charges:         PT G Codes:          Manley Mason SPT 02/03/2014, 8:55 AM Agree with above assessment.  Kittie Plater, PT, DPT Pager #: [phone] Office #: [phone]

## 2014-02-03 NOTE — Consult Note (Addendum)
WOC assisance by bedside nurse to obtain Interdry fabric.  Discussed plan of care via phone call with bedside nurse.  She states pt has red, moist, macerated skin in abd and breast folds and appearance is consistent with intertrigo.  Orders and instructions provided for Interdry silver-impregnated fabric.  This will wick moisture away from skin and provide antimicrobial benefits. This should remain in place for 5 days for optimal plan of care. Please re-consult if further assistance is needed.  Thank-you,  Julien Girt MSN, [place], [place], [place], [place]

## 2014-02-03 NOTE — Progress Notes (Signed)
CRITICAL VALUE ALERT  Critical value received:  CO2  Date of notification:  02/03/2014  Time of notification:  2039  Critical value read back: Yes  Nurse who received alert:  Nolon Nations  MD notified (1st page):  yes  Time of first page:  2049  MD notified (2nd page):  Time of second page:  Responding MD:  IMTS  Time MD responded:  2053

## 2014-02-03 NOTE — Progress Notes (Signed)
Pt requested to have C-pap removed. C-pap removed and 6l Nasal cannula was put on the patient. Will continue to monitor pt.

## 2014-02-03 NOTE — Evaluation (Signed)
Physical Therapy Evaluation Patient Details Name: LAVELLE BERLAND MRN: [id number] DOB: [birth year] Today's Date: 02/03/2014   History of Present Illness  I have seen and evaluated Rande Lawman and discussed their care with the Residency Team.  58 yr old woman with hx COPD, morbid obesity, hx incarcerated hernia repair in 2010, presented with SOB. One of her daughters stated her abdomen and neck have become more swollen recently. She has also developed abdominal erythema. She is supposed to be on some inhalers but has not been taking these as prescribed (?) and has not been consistently taking lasix. She was noted to be hypoxic in the ED in addition to having evidence of hypercarbia and thus placed on BiPAP.  Clinical Impression  Pt adm with the above dx. Pt required assistance from daughters for ADLs and amb with Rollator Walker PTA. Pt functional mobility was limited to fatigue and Bilat knee pain. Pt able to amb on this date. Pt reports that her daughters will be available to provide 24 hr assistance upon d/c. Pt to benefit from skilled acute PT to address limitations listed below.     Follow Up Recommendations Home health PT;Supervision/Assistance - 24 hour    Equipment Recommendations  None recommended by PT (pt has necessary equipment. may need to go home with O2)    Recommendations for Other Services       Precautions / Restrictions Precautions Precautions: Fall Precaution Comments: Bilat degenerative knees  Restrictions Weight Bearing Restrictions: No      Mobility  Bed Mobility Overal bed mobility: Needs Assistance Bed Mobility: Supine to Sit     Supine to sit: Min guard     General bed mobility comments: verbal cues for safety and sequencing getting to EOB  Transfers Overall transfer level: Needs assistance Equipment used: Rolling walker (2 wheeled);1 person hand held assist Transfers: Sit to/from [facility] Sit to Stand: Min assist Stand pivot  transfers: Min assist       General transfer comment: verbal cues for hand placement and step sequencing. pt req min A to transfer to [facility]. pt unable to use restroom but reports need to have a BM. min A to achieve upright standing position. pt req Bilat UE support in standing. pt tends to lean on RW with forearms. pt is about 4'11'' and RW was too high.  Ambulation/Gait Ambulation/Gait assistance: Min assist Ambulation Distance (Feet): 25 Feet Assistive device: Rolling walker (2 wheeled);1 person hand held assist Gait Pattern/deviations: Step-to pattern;Decreased stride length;Trunk flexed;Wide base of support Gait velocity: slow;guarded for pain   General Gait Details: pt req min A to maintain support and balance. pt demo'd toe out gait pattern with wide BOS. pt limited by fatigue and Bilat knee pain (R>L). pt tends to flex trunk and lean on RW with forearms. req verbal and tactile cues to correct posture.   Stairs            Wheelchair Mobility    Modified Rankin (Stroke Patients Only)       Balance Overall balance assessment: Needs assistance Sitting-balance support: No upper extremity supported;Feet supported Sitting balance-Leahy Scale: Fair Sitting balance - Comments: pt able to sit EOB for 5 min without UE support at supervision level   Standing balance support: Bilateral upper extremity supported Standing balance-Leahy Scale: Poor Standing balance comment: pt req Bilat UE support to maintain standing balance. pt demonstrated 30 sec of standing with RW at supervision level but req min A during functional movements such as marching  in place or pivoting.                              Pertinent Vitals/Pain Pt's O2 sats dropped to 90% during [facility] transfer and 89% during amb.  Pt reports Bilat knee pain during transfers and amb.     Home Living Family/patient expects to be discharged to:: Private residence Living Arrangements: Children Available Help at  Discharge: Family;Available 24 hours/day Type of Home: Mobile home Home Access: Stairs to enter Entrance Stairs-Rails: Can reach both Entrance Stairs-Number of Steps: 2 Home Layout: One level Home Equipment: Walker - 4 wheels;Tub bench;Bedside commode Additional Comments: pt reports complaint of tub seat not having a back.    Prior Function Level of Independence: Needs assistance   Gait / Transfers Assistance Needed: Rollator Walker allowed pt to sit and rest when needed.   ADL's / Homemaking Assistance Needed: needs assist with dressing with donning pants and socks and shoes. req assist for tub transfer. often sponge bathes. daughters prepare meals for her.   Comments: pt was not on O2 at home.      Hand Dominance        Extremity/Trunk Assessment   Upper Extremity Assessment: Overall WFL for tasks assessed           Lower Extremity Assessment: Generalized weakness         Communication   Communication: No difficulties  Cognition Arousal/Alertness: Awake/alert Behavior During Therapy: WFL for tasks assessed/performed Overall Cognitive Status: Within Functional Limits for tasks assessed                      General Comments General comments (skin integrity, edema, etc.): pt transferred to [facility] but was unable to have BM. Pt became dizzy going from supine to sit and again sit to stand. O2 sats dropped to 90 % during [facility] transfer. O2 sats dropped as low as 89% during amb while on 6 L O2 [place].     Exercises        Assessment/Plan    PT Assessment Patient needs continued PT services  PT Diagnosis Difficulty walking;Generalized weakness   PT Problem List Decreased strength;Decreased activity tolerance;Decreased mobility;Decreased balance;Decreased coordination;Decreased knowledge of use of DME;Decreased safety awareness  PT Treatment Interventions DME instruction;Gait training;Stair training;Functional mobility training;Therapeutic activities;Therapeutic  exercise;Balance training;Patient/family education   PT Goals (Current goals can be found in the Care Plan section) Acute Rehab PT Goals Patient Stated Goal: return home PT Goal Formulation: With patient Time For Goal Achievement: 02/10/14 Potential to Achieve Goals: Good    Frequency Min 3X/week   Barriers to discharge        Co-evaluation               End of Session Equipment Utilized During Treatment: Gait belt;Oxygen (6L O2 [place]; RW) Activity Tolerance: Patient limited by fatigue;Patient limited by pain Patient left: in chair;with call bell/phone within reach Nurse Communication: Mobility status         Time: 5053-9767 PT Time Calculation (min): 38 min   Charges:         PT G Codes:          Manley Mason SPT 02/03/2014, 8:55 AM

## 2014-02-03 NOTE — Progress Notes (Signed)
Subjective: Attempted CPAP multiple times overnight, patient was unable to tolerate due to nausea. Patient reports only complaint is constipation and occasional nausea without vomiting, she reports no bowel movement x 1 week. This occasionally happens at home and she usually takes a fiber supplement.  Objective: Vital signs in last 24 hours: Filed Vitals:   02/03/14 0350 02/03/14 0500 02/03/14 0700 02/03/14 0840  BP: 151/78     Pulse: 81     Temp: 97.3 F (36.3 C)  98.7 F (37.1 C)   TempSrc: Oral  Oral   Resp: 11     Height:      Weight:  276 lb 7.3 oz (125.4 kg)    SpO2: 92%   96%   Weight change: -2 lb 8.7 oz (-1.154 kg)  Intake/Output Summary (Last 24 hours) at 02/03/14 4801 Last data filed at 02/03/14 0400  Gross per 24 hour  Intake    720 ml  Output   2500 ml  Net  -1780 ml   General: sitting in chair on O2 via [place] at 6L O2 Sat 95% HEENT: EOMI Cardiac: RRR, no rubs, murmurs or gallops Pulm: CTA no appreciated wheezing or rales Abd: soft, nontender, distended, BS present Ext: warm and well perfused, no edema Neuro: alert and oriented X3  Lab Results: Basic Metabolic Panel:  Recent Labs Lab 02/01/14 1810 02/02/14 0835 02/02/14 1810 02/03/14 0318  NA 144 145 139  --   K 3.8 4.3 3.8  --   CL 90* 89* 87*  --   CO2 45* 44* 45*  --   GLUCOSE 116* 107* 95  --   BUN 22 24* 23  --   CREATININE 0.74 0.94 0.78  --   CALCIUM 10.5 10.3 10.6*  --   MG 2.3  --   --  2.2  PHOS  --  3.9  --  3.8   Liver Function Tests:  Recent Labs Lab 01/29/14 1136  AST 36  ALT 47*  ALKPHOS 88  BILITOT 0.2*  PROT 7.1  ALBUMIN 3.2*   CBC:  Recent Labs Lab 01/29/14 1025  01/30/14 0310 02/02/14 0835  WBC 10.7*  --  9.9 9.3  NEUTROABS 7.9*  --   --   --   HGB 14.6  < > 13.7 14.5  HCT 47.7*  < > 46.3* 47.2*  MCV 93.0  --  94.9 92.5  PLT 284  --  296 279  < > = values in this interval not displayed. Cardiac Enzymes:  Recent Labs Lab 01/29/14 1136 01/29/14 1700  01/29/14 2317  TROPONINI <0.30 <0.30 <0.30   BNP:  Recent Labs Lab 01/29/14 1136 02/02/14 0835  PROBNP 1011.0* 226.2*   CBG:  Recent Labs Lab 01/30/14 0739 01/31/14 0818 02/01/14 0751 02/02/14 0754 02/03/14 0735  GLUCAP 118* 103* 118* 107* 114*   Hemoglobin A1C:  Recent Labs Lab 01/29/14 1545  HGBA1C 6.0*   Thyroid Function Tests:  Recent Labs Lab 01/29/14 1545  TSH 1.270   Urine Drug Screen: Drugs of Abuse     Component Value Date/Time   LABOPIA POSITIVE* 01/29/2014 1300   COCAINSCRNUR NONE DETECTED 01/29/2014 1300   LABBENZ POSITIVE* 01/29/2014 1300   AMPHETMU NONE DETECTED 01/29/2014 1300   THCU NONE DETECTED 01/29/2014 1300   LABBARB NONE DETECTED 01/29/2014 1300     Micro Results: Recent Results (from the past 240 hour(s))  MRSA PCR SCREENING     Status: None   Collection Time    01/29/14  2:43 PM      Result Value Ref Range Status   MRSA by PCR NEGATIVE  NEGATIVE Final   Comment:            The GeneXpert MRSA Assay (FDA     approved for NASAL specimens     only), is one component of a     comprehensive MRSA colonization     surveillance program. It is not     intended to diagnose MRSA     infection nor to guide or     monitor treatment for     MRSA infections.  CULTURE, BLOOD (ROUTINE X 2)     Status: None   Collection Time    01/29/14  3:45 PM      Result Value Ref Range Status   Specimen Description BLOOD RIGHT HAND   Final   Special Requests BOTTLES DRAWN AEROBIC ONLY 10CC   Final   Culture  Setup Time     Final   Value: 01/29/2014 19:17     Performed at [facility]   Culture     Final   Value:        BLOOD CULTURE RECEIVED NO GROWTH TO DATE CULTURE WILL BE HELD FOR 5 DAYS BEFORE ISSUING A FINAL NEGATIVE REPORT     Performed at [facility]   Report Status PENDING   Incomplete  CULTURE, BLOOD (ROUTINE X 2)     Status: None   Collection Time    01/29/14  3:51 PM      Result Value Ref Range Status   Specimen  Description BLOOD LEFT ARM   Final   Special Requests BOTTLES DRAWN AEROBIC AND ANAEROBIC 10CC   Final   Culture  Setup Time     Final   Value: 01/29/2014 19:16     Performed at [facility]   Culture     Final   Value:        BLOOD CULTURE RECEIVED NO GROWTH TO DATE CULTURE WILL BE HELD FOR 5 DAYS BEFORE ISSUING A FINAL NEGATIVE REPORT     Performed at [facility]   Report Status PENDING   Incomplete  RESPIRATORY VIRUS PANEL     Status: None   Collection Time    01/29/14  5:19 PM      Result Value Ref Range Status   Source - RVPAN NASOPHARYNGEAL   Final   Respiratory Syncytial Virus A NOT DETECTED   Final   Respiratory Syncytial Virus B NOT DETECTED   Final   Influenza A NOT DETECTED   Final   Influenza B NOT DETECTED   Final   Parainfluenza 1 NOT DETECTED   Final   Parainfluenza 2 NOT DETECTED   Final   Parainfluenza 3 NOT DETECTED   Final   Metapneumovirus NOT DETECTED   Final   Rhinovirus NOT DETECTED   Final   Adenovirus NOT DETECTED   Final   Influenza A H1 NOT DETECTED   Final   Influenza A H3 NOT DETECTED   Final   Comment: (NOTE)           Normal Reference Range for each Analyte: NOT DETECTED     Testing performed using the Luminex xTAG Respiratory Viral Panel test     kit.     This test was developed and its performance characteristics determined     by [facility]. It has not been cleared or approved by the [place]  Food and Drug Administration. This test is used for clinical purposes.     It should not be regarded as investigational or for research. This     laboratory is certified under the [place] (CLIA) as qualified to perform high complexity     clinical laboratory testing.     Performed at [facility]   Studies/Results: Ct Angio Chest Pe W/cm &/or Wo Cm  02/02/2014   CLINICAL DATA:  Shortness of breath, chest tightness, evaluate for PE  EXAM: CT ANGIOGRAPHY CHEST WITH CONTRAST   TECHNIQUE: Multidetector CT imaging of the chest was performed using the standard protocol during bolus administration of intravenous contrast. Multiplanar CT image reconstructions and MIPs were obtained to evaluate the vascular anatomy.  CONTRAST:  124m OMNIPAQUE IOHEXOL 350 MG/ML SOLN  COMPARISON:  CTA chest dated 10/04/2009.  FINDINGS: No evidence of pulmonary embolism.  Mild patchy right lower lobe opacity, likely atelectasis, pneumonia not excluded. Additional left upper and lower lobe atelectasis. Trace right pleural effusion. No pneumothorax.  Faint ground-glass opacities bilaterally (series 10/image 21), favored to reflect mosaic attenuation/air trapping rather than interstitial edema.  Cardiomegaly. No pericardial effusion. Mild atherosclerotic calcifications of the aortic arch.  No suspicious mediastinal or axillary lymphadenopathy.  Visualized thyroid is unremarkable.  Visualized upper abdomen is notable for hepatic steatosis and a 3.3 cm left hepatic lobe cyst.  Degenerative changes of the visualized thoracolumbar spine.  Review of the MIP images confirms the above findings.  IMPRESSION: No evidence of pulmonary embolism.  Mild patchy right lower lobe opacity, likely atelectasis, pneumonia not excluded.  Cardiomegaly. No frank interstitial edema. Trace right pleural effusion.   Electronically Signed   By: SJulian HyM.D.   On: 02/02/2014 18:18   Medications: I have reviewed the patient's current medications. Scheduled Meds: . cholecalciferol  2,000 Units Oral BID  . doxycycline  100 mg Oral Q12H  . enoxaparin (LOVENOX) injection  0.5 mg/kg Subcutaneous Q24H  . furosemide  80 mg Intravenous BID  . ipratropium-albuterol  3 mL Nebulization Q4H  . nicotine  21 mg Transdermal Daily  . polyethylene glycol  17 g Oral Daily  . potassium chloride  20 mEq Oral BID  . predniSONE  40 mg Oral Q breakfast  . sodium chloride  3 mL Intravenous Q12H   Continuous Infusions:  PRN Meds:.albuterol,  loratadine, ondansetron (ZOFRAN) IV, ondansetron, senna Assessment/Plan: Acute respiratory failure with hypercapnia  - Multifactorial, primary Acute congestive heart failure but compounded with chronic hypercapnia from COPD, possible COPD exacerbation, and likely obesity hypoventilation syndrome.  A CTA was obtained yesterday which was negative for pulmonary embolism. - diuresis as below - BiPAP PRN, maintain O2 Sat >92% - CPAP overnight as below - steriods/ antibiotics patient to complete day 5 today -PT eval and treat - Transfer to Telemetry  Acute systolic congestive heart failure - Right ventricular systolic dysfunction, EF 632-12%- Patient weight down 29 lbs, documented net I&O -18 L - Continue Lasix IV 862mBID -KDur 2062mBID - Daily weights  - Strict I&O  - Monitor electrolytes with BMP BID and Mag daily - Renal function stable with dieuresis - D/C foley  Possible OSA and obesity hypoventilation syndrome - Given patient's obesity and nocturnal desaturation, I suspect OSA - Unable to tolerate CPAP due to some nausea, can try again tonight if no longer nauseous - If CPAP ineffective may need nocturnal BiPAP  COPD exacerbation  -empiric Doxycycline thru 4/22 -  Prednisone 49m thru 4/22 -Duoneb q4 - O2  to maintain O2 saturation >92%  Hypokalemia- resolved -KDur 228m BID  Cellulitis-resolved -Empiric doxycycline thru 4/22  Morbid Obesity  - Likely contributory to respiratory status  Impaired fasting glucose -A1c 6.0  Uterine fibroid -Seen on CT imagining, patient reports she has been told before she has a fibroid. This does not appear to be causing her any discomfort at this time. -Recommend outpatient followup  Lobulated cyst in the liver  - Increased size since previous CT. Recommend continued monitoring with U/S as outpatient  Dispo: Disposition is deferred at this time, awaiting improvement of current medical problems.  Patient can still but diuresed  further.  The patient does have a current PCP (No Pcp Per Patient) and [facility].  The patient does not have transportation limitations that hinder transportation to clinic appointments.  .Services Needed at time of discharge: Y = Yes, Blank = No PT:   OT:   RN:   Equipment:   Other:     LOS: 5 days   ErJoni ReiningDO 02/03/2014, 9:22 AM

## 2014-02-03 NOTE — Progress Notes (Addendum)
Pt TX to 2W-13, VSS, called report. Family notified of [employer].

## 2014-02-03 NOTE — Progress Notes (Signed)
  Date: 02/03/2014  Patient name: BEVAN VU  Medical record number: [id number]  Date of birth: [birth year]   This patient has been seen and the plan of care was discussed with the house staff. Please see their note for complete details. I concur with their findings with the following additions/corrections: Sitting up in chair this morning. She did not tolerate CPAP last night due to nausea but is willing to try tonight. Reports constipation, which will be addressed with oral laxative. D/C foley. Continue diuresis, as she is improving and I see stability of her renal function, stability of her BP, and no worsening of contraction alkalosis.   Dominic Pea, DO, [facility] 02/03/2014, 2:03 PM

## 2014-02-03 NOTE — Progress Notes (Signed)
Pt. Refuses to wear CPAP at this time. Pt. Was taken off CPAP by RN & placed on 6L [place].

## 2014-02-03 NOTE — Progress Notes (Signed)
SATURATION QUALIFICATIONS: (This note is used to comply with regulatory documentation for home oxygen)  Patient Saturations on Room Air at Rest = 88%  Patient Saturations on Room Air while Ambulating = 81%  Patient Saturations on 5 Liters of oxygen while Ambulating = 90%  Please briefly explain why patient needs home oxygen:

## 2014-02-03 NOTE — Progress Notes (Signed)
Responded to pt.'s room per pt. Request to go back on CPAP. Upon entering pt.'s room pt. Was sitting up in the bed with a basin stating that she felt really nauseous. Pt. Stated that she couldn't tolerate CPAP earlier in the shift due to feeling nauseous & didn't want to wear it now. RN was made aware of this.

## 2014-02-04 DIAGNOSIS — J9600 Acute respiratory failure, unspecified whether with hypoxia or hypercapnia: Secondary | ICD-10-CM | POA: Diagnosis not present

## 2014-02-04 DIAGNOSIS — I509 Heart failure, unspecified: Secondary | ICD-10-CM | POA: Diagnosis not present

## 2014-02-04 DIAGNOSIS — J4489 Other specified chronic obstructive pulmonary disease: Secondary | ICD-10-CM

## 2014-02-04 DIAGNOSIS — J449 Chronic obstructive pulmonary disease, unspecified: Secondary | ICD-10-CM

## 2014-02-04 DIAGNOSIS — I5021 Acute systolic (congestive) heart failure: Secondary | ICD-10-CM | POA: Diagnosis not present

## 2014-02-04 LAB — GLUCOSE, CAPILLARY: Glucose-Capillary: 97 mg/dL (ref 70–99)

## 2014-02-04 LAB — BASIC METABOLIC PANEL
BUN: 26 mg/dL — ABNORMAL HIGH (ref 6–23)
CALCIUM: 10.5 mg/dL (ref 8.4–10.5)
CO2: 45 mEq/L (ref 19–32)
Chloride: 88 mEq/L — ABNORMAL LOW (ref 96–112)
Creatinine, Ser: 0.89 mg/dL (ref 0.50–1.10)
GFR, EST AFRICAN AMERICAN: 82 mL/min — AB (ref >90)
GFR, EST NON AFRICAN AMERICAN: 71 mL/min — AB (ref >90)
Glucose, Bld: 101 mg/dL — ABNORMAL HIGH (ref 70–99)
Potassium: 3.7 mEq/L (ref 3.7–5.3)
SODIUM: 143 meq/L (ref 137–147)

## 2014-02-04 LAB — CULTURE, BLOOD (ROUTINE X 2)
CULTURE: NO GROWTH
Culture: NO GROWTH

## 2014-02-04 LAB — PHOSPHORUS: Phosphorus: 3.8 mg/dL (ref 2.3–4.6)

## 2014-02-04 LAB — MAGNESIUM: MAGNESIUM: 2.2 mg/dL (ref 1.5–2.5)

## 2014-02-04 MED ORDER — ACETAMINOPHEN 325 MG PO TABS: 650.0000 mg | ORAL_TABLET | Freq: Four times a day (QID) | ORAL | Status: AC | PRN

## 2014-02-04 MED ORDER — ENOXAPARIN SODIUM 60 MG/0.6ML ~~LOC~~ SOLN
60.0000 mg | SUBCUTANEOUS | Status: DC
  Filled 2014-02-04: qty 0.6

## 2014-02-04 MED ORDER — ALBUTEROL SULFATE HFA 108 (90 BASE) MCG/ACT IN AERS: 1.0000 | INHALATION_SPRAY | Freq: Four times a day (QID) | RESPIRATORY_TRACT | Status: AC | PRN

## 2014-02-04 MED ORDER — ALBUTEROL SULFATE (2.5 MG/3ML) 0.083% IN NEBU: 2.5000 mg | INHALATION_SOLUTION | RESPIRATORY_TRACT | Status: DC | PRN

## 2014-02-04 MED ORDER — TIOTROPIUM BROMIDE MONOHYDRATE 18 MCG IN CAPS: 18.0000 ug | ORAL_CAPSULE | Freq: Every day | RESPIRATORY_TRACT | Status: AC

## 2014-02-04 MED ORDER — TIOTROPIUM BROMIDE MONOHYDRATE 18 MCG IN CAPS
18.0000 ug | ORAL_CAPSULE | Freq: Every day | RESPIRATORY_TRACT | Status: DC
  Administered 2014-02-04: 18 ug via RESPIRATORY_TRACT
  Filled 2014-02-04: qty 5

## 2014-02-04 MED ORDER — SENNA 8.6 MG PO TABS: 1.0000 | ORAL_TABLET | Freq: Every evening | ORAL | Status: AC | PRN

## 2014-02-04 MED ORDER — FUROSEMIDE 80 MG PO TABS: 80.0000 mg | ORAL_TABLET | Freq: Two times a day (BID) | ORAL | Status: AC

## 2014-02-04 MED ORDER — POTASSIUM CHLORIDE ER 20 MEQ PO TBCR: 20.0000 meq | EXTENDED_RELEASE_TABLET | Freq: Every day | ORAL | Status: AC

## 2014-02-04 MED ORDER — BUDESONIDE-FORMOTEROL FUMARATE 80-4.5 MCG/ACT IN AERO
2.0000 | INHALATION_SPRAY | Freq: Two times a day (BID) | RESPIRATORY_TRACT | Status: DC
  Administered 2014-02-04: 2 via RESPIRATORY_TRACT
  Filled 2014-02-04: qty 6.9

## 2014-02-04 MED ORDER — TIOTROPIUM BROMIDE MONOHYDRATE 18 MCG IN CAPS: 18.0000 ug | ORAL_CAPSULE | Freq: Every day | RESPIRATORY_TRACT | Status: DC

## 2014-02-04 NOTE — Discharge Summary (Signed)
Name: Ellen White MRN: [id number] DOB: October 03, 1956 58 y.o. PCP: No Pcp Per Patient  Date of Admission: 01/29/2014  9:50 AM Date of Discharge: 02/04/2014 Attending Physician: Dominic Pea, DO  Discharge Diagnosis: Principal Problem:   Acute respiratory failure with hypercapnia Active Problems:   Acute systolic congestive heart failure   Morbid obesity   Tobacco abuse   COPD (chronic obstructive pulmonary disease)   Impaired fasting glucose   Cellulitis   Benzodiazepine misuse  Discharge Medications:   Medication List    STOP taking these medications       ibuprofen 200 MG tablet  Commonly known as:  ADVIL,MOTRIN      TAKE these medications       acetaminophen 325 MG tablet  Commonly known as:  TYLENOL  Take 2 tablets (650 mg total) by mouth every 6 (six) hours as needed for mild pain.     albuterol 108 (90 BASE) MCG/ACT inhaler  Commonly known as:  PROVENTIL HFA;VENTOLIN HFA  Inhale 1-2 puffs into the lungs every 6 (six) hours as needed for wheezing or shortness of breath.     budesonide-formoterol 80-4.5 MCG/ACT inhaler  Commonly known as:  SYMBICORT  Inhale 2 puffs into the lungs 2 (two) times daily.     cholecalciferol 1000 UNITS tablet  Commonly known as:  VITAMIN D  Take 2,000 Units by mouth 2 (two) times daily.     furosemide 80 MG tablet  Commonly known as:  LASIX  Take 1 tablet (80 mg total) by mouth 2 (two) times daily.     loratadine 10 MG tablet  Commonly known as:  CLARITIN  Take 10 mg by mouth daily as needed for allergies.     METAMUCIL PO  Take 2 capsules by mouth daily.     senna 8.6 MG Tabs tablet  Commonly known as:  SENOKOT  Take 1 tablet (8.6 mg total) by mouth at bedtime as needed for mild constipation.     tiotropium 18 MCG inhalation capsule  Commonly known as:  SPIRIVA  Place 1 capsule (18 mcg total) into inhaler and inhale daily.        Disposition and follow-up:   Ms.Ellen White was discharged from [facility] in Stable condition.  At the hospital follow up visit please address:  1.  Respiratory and volume status.  Patient diuresed over 31 lbs inpatient.  Suspect further diuresis to happen was discharged on Lasix 80mg  PO BID.  Patient's potassium was very stable despite aggressive diuresis, she was discharged on KDur 96mEq daily.  She will need formal PFTs to evaluate her respiratory function once over acute episode.  2. Patient to have outpatient sleep Study (Split night) for suspected OSA and OHS, she was discharged with home CPAP  3. Patient will need referral to GYN for evaluation of large uterine fibroid seen on imaging of abdomen.  4. Liver cyst on imaging, increased in size would recommend continued monitoring.  5. Patient admitted some depression causing her to withdraw from healthcare, consider eval for depression.  6.  Labs / imaging needed at time of follow-up: BMP  7.  Pending labs/ test needing follow-up: None  Follow-up Appointments:     Follow-up Information   Follow up with Celedonio Savage, MD On 02/08/2014. (4/27 at 2:15 PM with Dr. Wenda Overland)    Specialty:  [facility] Medicine   Contact information:   [address] [place] [address] [phone]  Follow up with [place]. (8:00pm    Sleep Study          Center will mail admission packet to you)    Contact information:   [address] [place] [address] [phone]      Discharge Instructions: Discharge Orders   Future Orders Complete By Expires   Call MD for:  difficulty breathing, headache or visual disturbances  As directed    Diet - low sodium heart healthy  As directed    Discharge instructions  As directed    Increase activity slowly  As directed       Consultations:    Procedures Performed:  Ct Angio Chest Pe W/cm &/or Wo Cm  02/02/2014   CLINICAL DATA:  Shortness of breath, chest tightness, evaluate for PE  EXAM: CT ANGIOGRAPHY CHEST WITH CONTRAST   TECHNIQUE: Multidetector CT imaging of the chest was performed using the standard protocol during bolus administration of intravenous contrast. Multiplanar CT image reconstructions and MIPs were obtained to evaluate the vascular anatomy.  CONTRAST:  158mL OMNIPAQUE IOHEXOL 350 MG/ML SOLN  COMPARISON:  CTA chest dated 10/04/2009.  FINDINGS: No evidence of pulmonary embolism.  Mild patchy right lower lobe opacity, likely atelectasis, pneumonia not excluded. Additional left upper and lower lobe atelectasis. Trace right pleural effusion. No pneumothorax.  Faint ground-glass opacities bilaterally (series 10/image 21), favored to reflect mosaic attenuation/air trapping rather than interstitial edema.  Cardiomegaly. No pericardial effusion. Mild atherosclerotic calcifications of the aortic arch.  No suspicious mediastinal or axillary lymphadenopathy.  Visualized thyroid is unremarkable.  Visualized upper abdomen is notable for hepatic steatosis and a 3.3 cm left hepatic lobe cyst.  Degenerative changes of the visualized thoracolumbar spine.  Review of the MIP images confirms the above findings.  IMPRESSION: No evidence of pulmonary embolism.  Mild patchy right lower lobe opacity, likely atelectasis, pneumonia not excluded.  Cardiomegaly. No frank interstitial edema. Trace right pleural effusion.   Electronically Signed   By: Julian Hy M.D.   On: 02/02/2014 18:18   Ct Abdomen Pelvis W Contrast  02/01/2014   CLINICAL DATA:  Left upper quadrant mass  EXAM: CT ABDOMEN AND PELVIS WITH CONTRAST  TECHNIQUE: Multidetector CT imaging of the abdomen and pelvis was performed using the standard protocol following bolus administration of intravenous contrast.  CONTRAST:  158mL OMNIPAQUE IOHEXOL 300 MG/ML  SOLN  COMPARISON:  MR L SPINE W/O dated 07/17/2012; CT ABDOMEN W/CM dated 10/07/2009; CT PELVIS W/CM dated 10/07/2009  FINDINGS: The is lobulated cyst in the left lobe of the liver has enlarged. This is of unknown  significance. There is otherwise stable  Gallbladder, spleen, pancreas, adrenal glands are within normal limits. Hypodensity in the posterior left kidney is stable. Right kidney is unremarkable  Status post ventral hernia repair. Postoperative changes are noted. No evidence of recurrent hernia. There is stranding in the subcutaneous fat likely due to postoperative scarring.  Foley catheter decompresses the bladder. A large fundal uterine fibroid is suspected measuring 8.1 x 10.9 cm.  There is anterolisthesis L5 upon S1 without pars defects.  Abdominal aorta is ectatic with a maximal diameter of 3.1 cm. Atherosclerotic changes are noted.  Bibasilar atelectasis versus airspace disease at the lung bases.  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 There is advanced facet arthropathy in the lower lumbar spine.  IMPRESSION: Status post ventral hernia repair. No evidence of recurrent hernia or bowel obstruction.  Lobulated cyst in the liver has enlarged. This  is of unknown significance. It has a benign appearance.  Large fundal uterine fibroid is suspected. This can be confirmed with ultrasound.   Electronically Signed   By: Maryclare Bean M.D.   On: 02/01/2014 01:06   Dg Chest Port 1 View  01/29/2014   CLINICAL DATA:  Shortness of breath  EXAM: PORTABLE CHEST - 1 VIEW  COMPARISON:  DG CHEST 2V dated 05/27/2012  FINDINGS: Lung bases are partly omitted from the field of view. Moderate enlargement of the cardiac silhouette is reidentified with central vascular congestion. Allowing for technique, no focal consolidation. No pleural effusion.  IMPRESSION: Cardiomegaly with central vascular congestion. If symptoms persist, consider PA and lateral chest radiographs obtained at full inspiration when the patient is clinically able.   Electronically Signed   By: Conchita Paris M.D.   On: 01/29/2014 10:30    2D Echo: 01/30/14 Study Conclusions  - Left ventricle: The cavity size was normal. Wall thickness was increased in a pattern of  moderate LVH. Systolic function was normal. The estimated ejection fraction was in the range of 60% to 65%. Wall motion was normal; there were no regional wall motion abnormalities. - Aortic valve: Mildly calcified annulus. Probably trileaflet. No significant regurgitation. - Mitral valve: Calcified annulus. Trivial regurgitation. - Left atrium: Poorly visualized. The atrium was at the upper limits of normal in size. - Right ventricle: The cavity size was moderately to possibly severely dilated. Systolic function was reduced. - Right atrium: Poorly visualized. Central venous pressure: 89mm Hg (est). - Tricuspid valve: Poorly visualized. Trivial regurgitation. - Pulmonary arteries: PA peak pressure: 92mm Hg (S). - Coronary sinus: The vessel was dilated. Possibly increased right atrial pressure. - Pericardium, extracardiac: There was no pericardial effusion. Impressions:  - Very limited images. Moderate LVH with LVEF 60-65%, indeterminate diastolic function. Upper normal left atrial size. MAC with trivial mitral regurgitation. RV is at leastmoderately dilated with reduced contraction. Mildly elevated PASP of 39 mmHg,and there is evidence of probable increased right atrial pressure and CVP.  Admission HPI: TUJUANA EWINGS is a 58 yo white female with a PMH of COPD, Asthma?, Tobacco abuse, incarcerated hernia with repair in 2010.  She is accompanied by her two daughters, she is currently on bipap, history is provided by the daughters and the patient. Per the daughters patient has not seen her PCP in over a year and has been suffering from some depression since her mother died last year. Recently they have noticed that she has become more "puffy" and "swollen." In addition they note that she has gotten progressively SOB at home over the past two weeks. They do note that she had a "cold a little over two weeks ago but recovered from that prior to getting short of breath. The daughter's were  concerned about her and tried to schedule an appointment with her PCP (was supposed to happen today) however this morning she was even more short of breath and "panting and wheezing" and they decided to bring her to the hospital instead. They do note that she has been using Albuterol at home PRN (ran out of her own but uses grandson's) has run out of symbicort and spirivia. She has been prescribed Lasix for lower extremity edema (no known heart failure) but has been "saving her last pill until the swelling gets worse."  She reports a chronic cough - non-productive, smokes 1-2 ppd, denies fever/chills, reports wheezing piror to arival, chronic 3 pillow orthopnea.  In the ED shoe was found to  be hypoxic, she was given duoneb breathing teratment, 60mg  PO prednisone, an ABG was checked which confirmed hypercarbic respiratory failure with a chronic respiratory acidosis. She was started on BIPAP and IMTS was asked to admit.   Hospital Course by problem list: Acute respiratory failure with hypercapnia  - Patient presented with acute respiratory failure and was started on BIPAP in the emergency room.  Initial ABG showed a chronic respiratory acidosis with hypercapnia.  Given her history and physical exam it was suspected that her cause of respiratory failure was primarily CHF in the setting on chronic COPD, OSA, and OHA (obesity hypoventilation syndrome).  She was started on IV lasix for aggressive dieresis.  Given her acuity she was also given treatment for empiric COPD exacerbation as she was noted to have wheezing on initial exam in the ED.  Pulmonary was involved and followed her course.  On hospital day 3 despite aggressive dieresis she was still requiring 6L via [place] and had an elevated Aa gradient. and her diagnosis was revisited. She was low probability for a PE so a d-dimer was checked. This was elevated and a CTA was obtained which showed no evidence of pulmonary embolism.  She continued to diuresis well she  was started on empiric CPAP QHS due to nocturnal desaturation and suspected OSA.  She tolerated this and was ultimately discharged on home oxygen at 5L via [place] and CPAP QHS.  A sleep study was scheduled for her to confirm diagnosis and titrate CPAP settings.  Acute systolic congestive heart failure  - On presentation patient appeared grossly volume overloaded and edematous.  CHF was suspected.  A TTE was obtained which showed right ventricular systolic dysfunction, EF 24-26%.  She was diuresed 20L and 31 lbs during her stay.  She was discharged on 80mg  of Lasix BID and will have close follow up with PCP.  Once recovered from acute episode she may be started on BB.  Suspected OSA and obesity hypoventilation syndrome  - Patient's initial labs suggested chronic hypoventilation, we suspected given her obesity that OSA and OHS were contributory.  In initially she was treated with BIPAP but this was weaned to supplemental O2 via nasal cannula.  She experience nocturnal desaturations at night and was started on empiric CPAP QHS.  She was discharged on home CPAP with 5L O2.  She will have a sleep study as outpatient.  COPD  -She was treated with a 5 day course of prednisone and doxycycline for presumed COPD exacerbation and albuterol and ipratropium nebulization.  After thsi course she was resumed on her home symbicort and sprivia.  She will need formal PFTs as outpatient once she has recovered from her acute episode.  Smoking cessation was strongly recommended.  Mild Hypokalemia -In the setting of aggressive diuresis. She was given potassium supplementation and discharged on KDur 41mEq daily.  Cellulitis -Of abdomen, suspect volume overload was contributing she was treated with a 5 day course of doxycycline and this resolved.   Impaired fasting glucose  -An A1c was checked and returned at 6.0.  Uterine fibroid  -Seen on CT imagining, patient reports she has been told before she has a fibroid. This does  not appear to be causing her any discomfort and we recommend outpatient follow up.  Lobulated cyst in the liver  - Increased size since previous CT. Recommend continued monitoring with U/S as outpatient   Discharge Vitals:   BP 150/93  Pulse 88  Temp(Src) 97.6 F (36.4 C) (Oral)  Resp 19  Ht 4\' 11"  (1.499 m)  Wt 274 lb 4 oz (124.4 kg)  BMI 55.36 kg/m2  SpO2 92%  Discharge Labs:  Results for orders placed during the hospital encounter of 01/29/14 (from the past 24 hour(s))  BASIC METABOLIC PANEL     Status: Abnormal   Collection Time    02/03/14  6:58 PM      Result Value Ref Range   Sodium 142  137 - 147 mEq/L   Potassium 4.8  3.7 - 5.3 mEq/L   Chloride 88 (*) 96 - 112 mEq/L   CO2 44 (*) 19 - 32 mEq/L   Glucose, Bld 117 (*) 70 - 99 mg/dL   BUN 27 (*) 6 - 23 mg/dL   Creatinine, Ser 1.04  0.50 - 1.10 mg/dL   Calcium 10.7 (*) 8.4 - 10.5 mg/dL   GFR calc non Af Amer 59 (*) >90 mL/min   GFR calc Af Amer 68 (*) >90 mL/min  MAGNESIUM     Status: None   Collection Time    02/04/14  3:44 AM      Result Value Ref Range   Magnesium 2.2  1.5 - 2.5 mg/dL  PHOSPHORUS     Status: None   Collection Time    02/04/14  3:44 AM      Result Value Ref Range   Phosphorus 3.8  2.3 - 4.6 mg/dL  GLUCOSE, CAPILLARY     Status: None   Collection Time    02/04/14  6:12 AM      Result Value Ref Range   Glucose-Capillary 97  70 - 99 mg/dL  BASIC METABOLIC PANEL     Status: Abnormal   Collection Time    02/04/14  7:55 AM      Result Value Ref Range   Sodium 143  137 - 147 mEq/L   Potassium 3.7  3.7 - 5.3 mEq/L   Chloride 88 (*) 96 - 112 mEq/L   CO2 45 (*) 19 - 32 mEq/L   Glucose, Bld 101 (*) 70 - 99 mg/dL   BUN 26 (*) 6 - 23 mg/dL   Creatinine, Ser 0.89  0.50 - 1.10 mg/dL   Calcium 10.5  8.4 - 10.5 mg/dL   GFR calc non Af Amer 71 (*) >90 mL/min   GFR calc Af Amer 82 (*) >90 mL/min    Signed: Joni Reining, DO 02/04/2014, 1:12 PM   Time Spent on Discharge: 45 minutes Services  Ordered on Discharge: PT Equipment Ordered on Discharge: CPAP, Home O2 at 5L

## 2014-02-04 NOTE — Progress Notes (Signed)
  Date: 02/04/2014  Patient name: Ellen White  Medical record number: [id number]  Date of birth: 05-07-1956   This patient has been seen and the plan of care was discussed with the house staff. Please see their note for complete details. I concur with their findings with the following additions/corrections: Clinically improved this morning. She will need home O2 along with CPAP. She needs an outpatient sleep study. Agree with D/C on lasix 80 mg po bid. Based on her Cr of 0.89 and fairly stable HCO3, I suspect she has more room for diuresis. She has been receiving Kdur 20 meq BID, so I could continue this as outpatient given degree of diuresis. Her BMP can be checked in clinic in about 7 days. Otherwise, medically stable for D/C.  Dominic Pea, DO, [facility] 02/04/2014, 1:21 PM

## 2014-02-04 NOTE — Progress Notes (Signed)
02/04/2014 A. Xareni Kelch RNCM 1746pm EDCM recieved phone call from Dr. Heber Bowen regarding home health services.  As per Dr. Heber Peconic, he would like to add PT to home health orders to be sent to [facility].  [facility] faxed home health orders with PT addition, face sheet, face to face, and progress note to [facility] at [phone] at 1818pm with confirmation of receipt at 1825pm.  No further EDCM need at this time.

## 2014-02-04 NOTE — Progress Notes (Signed)
SATURATION QUALIFICATIONS: (This note is used to comply with regulatory documentation for home oxygen)  Patient Saturations on Room Air at Rest = 85%  Please briefly explain why patient needs home oxygen: Unable to tolerate sitting without supplemental oxygen.

## 2014-02-04 NOTE — Progress Notes (Signed)
Subjective: Patient reports feeling much better today, was able to tolerate CPAP with as nasal mask last night. Objective: Vital signs in last 24 hours: Filed Vitals:   02/04/14 0003 02/04/14 0005 02/04/14 0401 02/04/14 0607  BP:    150/93  Pulse:  76  88  Temp:    97.6 F (36.4 C)  TempSrc:    Oral  Resp:  14  19  Height:      Weight:    274 lb 4 oz (124.4 kg)  SpO2: 97% 97% 96% 90%   Weight change: -2 lb 3.3 oz (-1 kg)  Intake/Output Summary (Last 24 hours) at 02/04/14 0831 Last data filed at 02/04/14 0620  Gross per 24 hour  Intake    600 ml  Output   2200 ml  Net  -1600 ml   General: sitting in chair Cardiac: RRR, no murmurs  Pulm: CTA with mild expiratory wheezing Abd: soft, nontender, distended, BS present Ext: warm and well perfused, no edema   Lab Results: Basic Metabolic Panel:  Recent Labs Lab 02/03/14 0318 02/03/14 0954 02/03/14 1858 02/04/14 0344  NA  --  140 142  --   K  --  4.9 4.8  --   CL  --  86* 88*  --   CO2  --  41* 44*  --   GLUCOSE  --  126* 117*  --   BUN  --  24* 27*  --   CREATININE  --  0.82 1.04  --   CALCIUM  --  10.4 10.7*  --   MG 2.2  --   --  2.2  PHOS 3.8  --   --  3.8   Liver Function Tests:  Recent Labs Lab 01/29/14 1136  AST 36  ALT 47*  ALKPHOS 88  BILITOT 0.2*  PROT 7.1  ALBUMIN 3.2*   CBC:  Recent Labs Lab 01/29/14 1025  01/30/14 0310 02/02/14 0835  WBC 10.7*  --  9.9 9.3  NEUTROABS 7.9*  --   --   --   HGB 14.6  < > 13.7 14.5  HCT 47.7*  < > 46.3* 47.2*  MCV 93.0  --  94.9 92.5  PLT 284  --  296 279  < > = values in this interval not displayed. Cardiac Enzymes:  Recent Labs Lab 01/29/14 1136 01/29/14 1700 01/29/14 2317  TROPONINI <0.30 <0.30 <0.30   BNP:  Recent Labs Lab 01/29/14 1136 02/02/14 0835  PROBNP 1011.0* 226.2*   CBG:  Recent Labs Lab 01/30/14 0739 01/31/14 0818 02/01/14 0751 02/02/14 0754 02/03/14 0735 02/04/14 0612  GLUCAP 118* 103* 118* 107* 114* 97    Hemoglobin A1C:  Recent Labs Lab 01/29/14 1545  HGBA1C 6.0*   Thyroid Function Tests:  Recent Labs Lab 01/29/14 1545  TSH 1.270   Urine Drug Screen: Drugs of Abuse     Component Value Date/Time   LABOPIA POSITIVE* 01/29/2014 1300   COCAINSCRNUR NONE DETECTED 01/29/2014 1300   LABBENZ POSITIVE* 01/29/2014 1300   AMPHETMU NONE DETECTED 01/29/2014 1300   THCU NONE DETECTED 01/29/2014 1300   LABBARB NONE DETECTED 01/29/2014 1300     Micro Results: Recent Results (from the past 240 hour(s))  MRSA PCR SCREENING     Status: None   Collection Time    01/29/14  2:43 PM      Result Value Ref Range Status   MRSA by PCR NEGATIVE  NEGATIVE Final   Comment:  The GeneXpert MRSA Assay (FDA     approved for NASAL specimens     only), is one component of a     comprehensive MRSA colonization     surveillance program. It is not     intended to diagnose MRSA     infection nor to guide or     monitor treatment for     MRSA infections.  CULTURE, BLOOD (ROUTINE X 2)     Status: None   Collection Time    01/29/14  3:45 PM      Result Value Ref Range Status   Specimen Description BLOOD RIGHT HAND   Final   Special Requests BOTTLES DRAWN AEROBIC ONLY 10CC   Final   Culture  Setup Time     Final   Value: 01/29/2014 19:17     Performed at [facility]   Culture     Final   Value:        BLOOD CULTURE RECEIVED NO GROWTH TO DATE CULTURE WILL BE HELD FOR 5 DAYS BEFORE ISSUING A FINAL NEGATIVE REPORT     Performed at [facility]   Report Status PENDING   Incomplete  CULTURE, BLOOD (ROUTINE X 2)     Status: None   Collection Time    01/29/14  3:51 PM      Result Value Ref Range Status   Specimen Description BLOOD LEFT ARM   Final   Special Requests BOTTLES DRAWN AEROBIC AND ANAEROBIC 10CC   Final   Culture  Setup Time     Final   Value: 01/29/2014 19:16     Performed at [facility]   Culture     Final   Value:        BLOOD CULTURE RECEIVED NO GROWTH  TO DATE CULTURE WILL BE HELD FOR 5 DAYS BEFORE ISSUING A FINAL NEGATIVE REPORT     Performed at [facility]   Report Status PENDING   Incomplete  RESPIRATORY VIRUS PANEL     Status: None   Collection Time    01/29/14  5:19 PM      Result Value Ref Range Status   Source - RVPAN NASOPHARYNGEAL   Final   Respiratory Syncytial Virus A NOT DETECTED   Final   Respiratory Syncytial Virus B NOT DETECTED   Final   Influenza A NOT DETECTED   Final   Influenza B NOT DETECTED   Final   Parainfluenza 1 NOT DETECTED   Final   Parainfluenza 2 NOT DETECTED   Final   Parainfluenza 3 NOT DETECTED   Final   Metapneumovirus NOT DETECTED   Final   Rhinovirus NOT DETECTED   Final   Adenovirus NOT DETECTED   Final   Influenza A H1 NOT DETECTED   Final   Influenza A H3 NOT DETECTED   Final   Comment: (NOTE)           Normal Reference Range for each Analyte: NOT DETECTED     Testing performed using the Luminex xTAG Respiratory Viral Panel test     kit.     This test was developed and its performance characteristics determined     by [facility]. It has not been cleared or approved by the [place]     Food and Drug Administration. This test is used for clinical purposes.     It should not be regarded as investigational or for research. This     laboratory is  certified under the [place] (CLIA) as qualified to perform high complexity     clinical laboratory testing.     Performed at [facility]   Studies/Results: Ct Angio Chest Pe W/cm &/or Wo Cm  02/02/2014   CLINICAL DATA:  Shortness of breath, chest tightness, evaluate for PE  EXAM: CT ANGIOGRAPHY CHEST WITH CONTRAST  TECHNIQUE: Multidetector CT imaging of the chest was performed using the standard protocol during bolus administration of intravenous contrast. Multiplanar CT image reconstructions and MIPs were obtained to evaluate the vascular anatomy.  CONTRAST:  119m OMNIPAQUE IOHEXOL  350 MG/ML SOLN  COMPARISON:  CTA chest dated 10/04/2009.  FINDINGS: No evidence of pulmonary embolism.  Mild patchy right lower lobe opacity, likely atelectasis, pneumonia not excluded. Additional left upper and lower lobe atelectasis. Trace right pleural effusion. No pneumothorax.  Faint ground-glass opacities bilaterally (series 10/image 21), favored to reflect mosaic attenuation/air trapping rather than interstitial edema.  Cardiomegaly. No pericardial effusion. Mild atherosclerotic calcifications of the aortic arch.  No suspicious mediastinal or axillary lymphadenopathy.  Visualized thyroid is unremarkable.  Visualized upper abdomen is notable for hepatic steatosis and a 3.3 cm left hepatic lobe cyst.  Degenerative changes of the visualized thoracolumbar spine.  Review of the MIP images confirms the above findings.  IMPRESSION: No evidence of pulmonary embolism.  Mild patchy right lower lobe opacity, likely atelectasis, pneumonia not excluded.  Cardiomegaly. No frank interstitial edema. Trace right pleural effusion.   Electronically Signed   By: SJulian HyM.D.   On: 02/02/2014 18:18   Medications: I have reviewed the patient's current medications. Scheduled Meds: . cholecalciferol  2,000 Units Oral BID  . doxycycline  100 mg Oral Q12H  . enoxaparin (LOVENOX) injection  0.5 mg/kg Subcutaneous Q24H  . furosemide  80 mg Intravenous BID  . ipratropium-albuterol  3 mL Nebulization Q4H  . nicotine  21 mg Transdermal Daily  . polyethylene glycol  17 g Oral Daily  . sodium chloride  3 mL Intravenous Q12H   Continuous Infusions:  PRN Meds:.acetaminophen, loratadine, ondansetron (ZOFRAN) IV, ondansetron, senna Assessment/Plan: Acute respiratory failure with hypercapnia  - Multifactorial, primary Acute congestive heart failure but compounded with chronic hypercapnia from COPD, possible COPD exacerbation, and likely obesity hypoventilation syndrome.  A CTA was  which was negative for pulmonary  embolism. - Patient is still O2 dependent.  She has dieursed over 31 lbs, and completed a 5 day course of steroids for COPD exacerbation.  She was able to sleep better with empiric trial of CPAP overnight.  She would like to go home if possible.   -Will D/C home today, with PO lasix, home O2, home CPAP, she will resume medications for COPD. I have scheduled her close follow up with her PCP.  Acute systolic congestive heart failure - Right ventricular systolic dysfunction, EF 662-22%- Patient weight down 31 lbs, documented net I&O -20 L - Patient Cr bumped slightly last night but is 0.89 today, she reports great improvement overall, she is still O2 dependent. - Likely has more room for dieuresis - WIll D/C on Lasix 865mPO BID and 2084mof KDur, she has close follow up with PCP scheduled for monday  Possible OSA and obesity hypoventilation syndrome - Tolerated CPAP overnight -CM helping to schedule a sleep study - Can d/c home with CPAP with max setting of 20 over 4. - Close follow up with PCP  COPD -Resume Symbicort and Spirivia - Albuterol  PRN - Home O2 at 5L [place]   Hypokalemia- resolved -Will continue KDur 65mq daily on discharge  Cellulitis-resolved -Empiric doxycycline thru 4/22  Morbid Obesity  - Likely contributory to respiratory status  Impaired fasting glucose -A1c 6.0  Uterine fibroid -Seen on CT imagining, patient reports she has been told before she has a fibroid. This does not appear to be causing her any discomfort at this time. -Recommend outpatient followup  Lobulated cyst in the liver  - Increased size since previous CT. Recommend continued monitoring with U/S as outpatient  Dispo: D/C home today.  The patient does have a current PCP (Dr BLeda Quail and [facility].  The patient does not have transportation limitations that hinder transportation to clinic appointments.  .Services Needed at time of  discharge: Y = Yes, Blank = No PT: y  OT:   RN:   Equipment:   Other:     LOS: 6 days   EJoni Reining DO 02/04/2014, 8:31 AM

## 2014-02-04 NOTE — Discharge Instructions (Signed)
You were admitted for acute respiratory failure.   We encourage you to lose weight, exercise regularly, and stop smoking. Any of these will help your breathing.  Please use your CPAP with oxygen at home when you sleep. Please take your diuretic pill Lasix daily as prescribed. Continue taking Symbicort and Spiriva inhalers daily, and albuterol as needed.  Please follow up with Dr. Wenda Overland at [facility] practice on Monday 4/27 at 2:15 PM. You should also work with your primary doctor to arrange a sleep study.   Chronic Obstructive Pulmonary Disease Chronic obstructive pulmonary disease (COPD) is a common lung condition in which airflow from the lungs is limited. COPD is a general term that can be used to describe many different lung problems that limit airflow, including both chronic bronchitis and emphysema. If you have COPD, your lung function will probably never return to normal, but there are measures you can take to improve lung function and make yourself feel better.  CAUSES   Smoking (common).   Exposure to secondhand smoke.   Genetic problems.  Chronic inflammatory lung diseases or recurrent infections. SYMPTOMS   Shortness of breath, especially with physical activity.   Deep, persistent (chronic) cough with a large amount of thick mucus.   Wheezing.   Rapid breaths (tachypnea).   Gray or bluish discoloration (cyanosis) of the skin, especially in fingers, toes, or lips.   Fatigue.   Weight loss.   Frequent infections or episodes when breathing symptoms become much worse (exacerbations).   Chest tightness. DIAGNOSIS  Your healthcare provider will take a medical history and perform a physical examination to make the initial diagnosis. Additional tests for COPD may include:   Lung (pulmonary) function tests.  Chest X-ray.  CT scan.  Blood tests. TREATMENT  Treatment available to help you feel better when you have COPD include:   Inhaler and nebulizer  medicines. These help manage the symptoms of COPD and make your breathing more comfortable  Supplemental oxygen. Supplemental oxygen is only helpful if you have a low oxygen level in your blood.   Exercise and physical activity. These are beneficial for nearly all people with COPD. Some people may also benefit from a pulmonary rehabilitation program. HOME CARE INSTRUCTIONS   Take all medicines (inhaled or pills) as directed by your health care provider.  Only take over-the-counter or prescription medicines for pain, fever, or discomfort as directed by your health care provider.   Avoid over-the-counter medicines or cough syrups that dry up your airway (such as antihistamines) and slow down the elimination of secretions unless instructed otherwise by your healthcare provider.   If you are a smoker, the most important thing that you can do is stop smoking. Continuing to smoke will cause further lung damage and breathing trouble. Ask your health care provider for help with quitting smoking. He or she can direct you to community resources or hospitals that provide support.  Avoid exposure to irritants such as smoke, chemicals, and fumes that aggravate your breathing.  Use oxygen therapy and pulmonary rehabilitation if directed by your health care provider. If you require home oxygen therapy, ask your healthcare provider whether you should purchase a pulse oximeter to measure your oxygen level at home.   Avoid contact with individuals who have a contagious illness.  Avoid extreme temperature and humidity changes.  Eat healthy foods. Eating smaller, more frequent meals and resting before meals may help you maintain your strength.  Stay active, but balance activity with periods of rest. Exercise  and physical activity will help you maintain your ability to do things you want to do.  Preventing infection and hospitalization is very important when you have COPD. Make sure to receive all the  vaccines your health care provider recommends, especially the pneumococcal and influenza vaccines. Ask your healthcare provider whether you need a pneumonia vaccine.  Learn and use relaxation techniques to manage stress.  Learn and use controlled breathing techniques as directed by your health care provider. Controlled breathing techniques include:   Pursed lip breathing. Start by breathing in (inhaling) through your nose for 1 second. Then, purse your lips as if you were going to whistle and breathe out (exhale) through the pursed lips for 2 seconds.   Diaphragmatic breathing. Start by putting one hand on your abdomen just above your waist. Inhale slowly through your nose. The hand on your abdomen should move out. Then purse your lips and exhale slowly. You should be able to feel the hand on your abdomen moving in as you exhale.   Learn and use controlled coughing to clear mucus from your lungs. Controlled coughing is a series of short, progressive coughs. The steps of controlled coughing are:  1. Lean your head slightly forward.  2. Breathe in deeply using diaphragmatic breathing.  3. Try to hold your breath for 3 seconds.  4. Keep your mouth slightly open while coughing twice.  5. Spit any mucus out into a tissue.  6. Rest and repeat the steps once or twice as needed. SEEK MEDICAL CARE IF:   You are coughing up more mucus than usual.   There is a change in the color or thickness of your mucus.   Your breathing is more labored than usual.   Your breathing is faster than usual.  SEEK IMMEDIATE MEDICAL CARE IF:   You have shortness of breath while you are resting.   You have shortness of breath that prevents you from:  Being able to talk.   Performing your usual physical activities.   You have chest pain lasting longer than 5 minutes.   Your skin color is more cyanotic than usual.  You measure low oxygen saturations for longer than 5 minutes with a pulse  oximeter. MAKE SURE YOU:   Understand these instructions.  Will watch your condition.  Will get help right away if you are not doing well or get worse. Document Released: 07/11/2005 Document Revised: 07/22/2013 Document Reviewed: 05/28/2013 [facility] Patient Information 2014 [place], [place].   Sleep Apnea  Sleep apnea is a sleep disorder characterized by abnormal pauses in breathing while you sleep. When your breathing pauses, the level of oxygen in your blood decreases. This causes you to move out of deep sleep and into light sleep. As a result, your quality of sleep is poor, and the system that carries your blood throughout your body (cardiovascular system) experiences stress. If sleep apnea remains untreated, the following conditions can develop:  High blood pressure (hypertension).  Coronary artery disease.  Inability to achieve or maintain an erection (impotence).  Impairment of your thought process (cognitive dysfunction). There are three types of sleep apnea: 1. Obstructive sleep apnea Pauses in breathing during sleep because of a blocked airway. 2. Central sleep apnea Pauses in breathing during sleep because the area of the brain that controls your breathing does not send the correct signals to the muscles that control breathing. 3. Mixed sleep apnea A combination of both obstructive and central sleep apnea. RISK FACTORS The following risk factors can increase  your risk of developing sleep apnea:  Being overweight.  Smoking.  Having narrow passages in your nose and throat.  Being of older age.  Being female.  Alcohol use.  Sedative and tranquilizer use.  Ethnicity. Among individuals younger than 35 years, African Americans are at increased risk of sleep apnea. SYMPTOMS   Difficulty staying asleep.  Daytime sleepiness and fatigue.  Loss of energy.  Irritability.  Loud, heavy snoring.  Morning headaches.  Trouble  concentrating.  Forgetfulness.  Decreased interest in sex. DIAGNOSIS  In order to diagnose sleep apnea, your caregiver will perform a physical examination. Your caregiver may suggest that you take a home sleep test. Your caregiver may also recommend that you spend the night in a sleep lab. In the sleep lab, several monitors record information about your heart, lungs, and brain while you sleep. Your leg and arm movements and blood oxygen level are also recorded. TREATMENT The following actions may help to resolve mild sleep apnea:  Sleeping on your side.   Using a decongestant if you have nasal congestion.   Avoiding the use of depressants, including alcohol, sedatives, and narcotics.   Losing weight and modifying your diet if you are overweight. There also are devices and treatments to help open your airway:  Oral appliances. These are custom-made mouthpieces that shift your lower jaw forward and slightly open your bite. This opens your airway.  Devices that create positive airway pressure. This positive pressure "splints" your airway open to help you breathe better during sleep. The following devices create positive airway pressure:  Continuous positive airway pressure (CPAP) device. The CPAP device creates a continuous level of air pressure with an air pump. The air is delivered to your airway through a mask while you sleep. This continuous pressure keeps your airway open.  Nasal expiratory positive airway pressure (EPAP) device. The EPAP device creates positive air pressure as you exhale. The device consists of single-use valves, which are inserted into each nostril and held in place by adhesive. The valves create very little resistance when you inhale but create much more resistance when you exhale. That increased resistance creates the positive airway pressure. This positive pressure while you exhale keeps your airway open, making it easier to breath when you inhale again.  Bilevel  positive airway pressure (BPAP) device. The BPAP device is used mainly in patients with central sleep apnea. This device is similar to the CPAP device because it also uses an air pump to deliver continuous air pressure through a mask. However, with the BPAP machine, the pressure is set at two different levels. The pressure when you exhale is lower than the pressure when you inhale.  Surgery. Typically, surgery is only done if you cannot comply with less invasive treatments or if the less invasive treatments do not improve your condition. Surgery involves removing excess tissue in your airway to create a wider passage way. Document Released: 09/21/2002 Document Revised: 01/26/2013 Document Reviewed: 02/07/2012 [facility] Patient Information 2014 [place].

## 2014-02-04 NOTE — Progress Notes (Deleted)
Lab reported "panic" CO2 of 45.  MD Coralyn Pear notified.  Await acknowledgement.  Of note, Pt's CO2 has been in 40's for several days.

## 2014-02-04 NOTE — Care Management Note (Signed)
    Page 1 of 1   02/04/2014     4:14:10 PM CARE MANAGEMENT NOTE 02/04/2014  Patient:  Ellen White, Ellen White   Account Number:  [id number]  Date Initiated:  02/02/2014  Documentation initiated by:  Marvetta Gibbons  Subjective/Objective Assessment:   Pt admitted with resp. failure     Action/Plan:   PTA pt lived at home- NCM to follow for potential d/c needs   Anticipated DC Date:  02/05/2014   Anticipated DC Plan:  [place]  CM consult      Choice offered to / List presented to:     DME arranged  CPAP  [facility]      DME agency  [facility].        Status of service:  Completed, signed off Medicare Important Message given?   (If response is "NO", the following Medicare IM given date fields will be blank) Date Medicare IM given:   Date Additional Medicare IM given:    Discharge Disposition:  HOME/SELF CARE  Per UR Regulation:  Reviewed for med. necessity/level of care/duration of stay  If discussed at [place] of Stay Meetings, dates discussed:   02/04/2014    Comments:  02/04/14 Ashley Murrain, BSN [phone] Pt for dc today with daughters.  Will need home O2 at 5L/[place] and CPAP for home.  OP sleep study arranged for pt at [place] for June 2 at 8pm.  Referral to [facility] for home O2 and CPAP needs.  Portable tanks delivered to patient's room prior to dc.  Appt information put on AVS.  02/02/14- 1200- Marvetta Gibbons RN, BSN [phone] Pt continues to wear VM and refuses CPAP or BIPAP. NCM to cont. to follow for d/c needs.

## 2014-02-04 NOTE — Progress Notes (Signed)
Pt placed on CPAP with 5L O2 bleed in at this time, autotitrate with a max of 15 and min of 5. Pt states this feels comfortable, no problems at this time.

## 2014-02-07 DIAGNOSIS — J449 Chronic obstructive pulmonary disease, unspecified: Secondary | ICD-10-CM | POA: Diagnosis not present

## 2014-02-07 DIAGNOSIS — I5021 Acute systolic (congestive) heart failure: Secondary | ICD-10-CM | POA: Diagnosis not present

## 2014-02-07 DIAGNOSIS — I509 Heart failure, unspecified: Secondary | ICD-10-CM | POA: Diagnosis not present

## 2014-02-07 DIAGNOSIS — IMO0001 Reserved for inherently not codable concepts without codable children: Secondary | ICD-10-CM | POA: Diagnosis not present

## 2014-02-08 DIAGNOSIS — G4733 Obstructive sleep apnea (adult) (pediatric): Secondary | ICD-10-CM | POA: Diagnosis not present

## 2014-02-08 DIAGNOSIS — I509 Heart failure, unspecified: Secondary | ICD-10-CM | POA: Diagnosis not present

## 2014-02-08 DIAGNOSIS — J449 Chronic obstructive pulmonary disease, unspecified: Secondary | ICD-10-CM | POA: Diagnosis not present

## 2014-02-08 DIAGNOSIS — F17200 Nicotine dependence, unspecified, uncomplicated: Secondary | ICD-10-CM | POA: Diagnosis not present

## 2014-02-08 DIAGNOSIS — I10 Essential (primary) hypertension: Secondary | ICD-10-CM | POA: Diagnosis not present

## 2014-02-08 NOTE — Discharge Summary (Signed)
  Date: 02/08/2014  Patient name: Ellen White  Medical record number: [id number]  Date of birth: 1956-02-10   This patient has been seen and the plan of care was discussed with the house staff. Please see their note for complete details. I concur with their findings and plan.  Dominic Pea, DO, [facility] 02/08/2014, 11:20 AM

## 2014-02-09 DIAGNOSIS — I509 Heart failure, unspecified: Secondary | ICD-10-CM | POA: Diagnosis not present

## 2014-02-09 DIAGNOSIS — J449 Chronic obstructive pulmonary disease, unspecified: Secondary | ICD-10-CM | POA: Diagnosis not present

## 2014-02-09 DIAGNOSIS — I5021 Acute systolic (congestive) heart failure: Secondary | ICD-10-CM | POA: Diagnosis not present

## 2014-02-09 DIAGNOSIS — IMO0001 Reserved for inherently not codable concepts without codable children: Secondary | ICD-10-CM | POA: Diagnosis not present

## 2014-02-12 DIAGNOSIS — I5021 Acute systolic (congestive) heart failure: Secondary | ICD-10-CM | POA: Diagnosis not present

## 2014-02-12 DIAGNOSIS — I509 Heart failure, unspecified: Secondary | ICD-10-CM | POA: Diagnosis not present

## 2014-02-12 DIAGNOSIS — IMO0001 Reserved for inherently not codable concepts without codable children: Secondary | ICD-10-CM | POA: Diagnosis not present

## 2014-02-12 DIAGNOSIS — J449 Chronic obstructive pulmonary disease, unspecified: Secondary | ICD-10-CM | POA: Diagnosis not present

## 2014-02-15 DIAGNOSIS — I5021 Acute systolic (congestive) heart failure: Secondary | ICD-10-CM | POA: Diagnosis not present

## 2014-02-15 DIAGNOSIS — IMO0001 Reserved for inherently not codable concepts without codable children: Secondary | ICD-10-CM | POA: Diagnosis not present

## 2014-02-15 DIAGNOSIS — I509 Heart failure, unspecified: Secondary | ICD-10-CM | POA: Diagnosis not present

## 2014-02-15 DIAGNOSIS — J449 Chronic obstructive pulmonary disease, unspecified: Secondary | ICD-10-CM | POA: Diagnosis not present

## 2014-02-16 DIAGNOSIS — J449 Chronic obstructive pulmonary disease, unspecified: Secondary | ICD-10-CM | POA: Diagnosis not present

## 2014-02-16 DIAGNOSIS — IMO0001 Reserved for inherently not codable concepts without codable children: Secondary | ICD-10-CM | POA: Diagnosis not present

## 2014-02-16 DIAGNOSIS — I509 Heart failure, unspecified: Secondary | ICD-10-CM | POA: Diagnosis not present

## 2014-02-16 DIAGNOSIS — I5021 Acute systolic (congestive) heart failure: Secondary | ICD-10-CM | POA: Diagnosis not present

## 2014-02-19 DIAGNOSIS — I5021 Acute systolic (congestive) heart failure: Secondary | ICD-10-CM | POA: Diagnosis not present

## 2014-02-19 DIAGNOSIS — I509 Heart failure, unspecified: Secondary | ICD-10-CM | POA: Diagnosis not present

## 2014-02-19 DIAGNOSIS — J449 Chronic obstructive pulmonary disease, unspecified: Secondary | ICD-10-CM | POA: Diagnosis not present

## 2014-02-19 DIAGNOSIS — IMO0001 Reserved for inherently not codable concepts without codable children: Secondary | ICD-10-CM | POA: Diagnosis not present

## 2014-02-23 DIAGNOSIS — IMO0001 Reserved for inherently not codable concepts without codable children: Secondary | ICD-10-CM | POA: Diagnosis not present

## 2014-02-23 DIAGNOSIS — J449 Chronic obstructive pulmonary disease, unspecified: Secondary | ICD-10-CM | POA: Diagnosis not present

## 2014-02-23 DIAGNOSIS — I5021 Acute systolic (congestive) heart failure: Secondary | ICD-10-CM | POA: Diagnosis not present

## 2014-02-23 DIAGNOSIS — I509 Heart failure, unspecified: Secondary | ICD-10-CM | POA: Diagnosis not present

## 2014-02-24 DIAGNOSIS — I509 Heart failure, unspecified: Secondary | ICD-10-CM | POA: Diagnosis not present

## 2014-02-24 DIAGNOSIS — J449 Chronic obstructive pulmonary disease, unspecified: Secondary | ICD-10-CM | POA: Diagnosis not present

## 2014-02-24 DIAGNOSIS — I5021 Acute systolic (congestive) heart failure: Secondary | ICD-10-CM | POA: Diagnosis not present

## 2014-02-24 DIAGNOSIS — IMO0001 Reserved for inherently not codable concepts without codable children: Secondary | ICD-10-CM | POA: Diagnosis not present

## 2014-02-25 DIAGNOSIS — I509 Heart failure, unspecified: Secondary | ICD-10-CM | POA: Diagnosis not present

## 2014-02-25 DIAGNOSIS — IMO0001 Reserved for inherently not codable concepts without codable children: Secondary | ICD-10-CM | POA: Diagnosis not present

## 2014-02-25 DIAGNOSIS — I5021 Acute systolic (congestive) heart failure: Secondary | ICD-10-CM | POA: Diagnosis not present

## 2014-02-25 DIAGNOSIS — J449 Chronic obstructive pulmonary disease, unspecified: Secondary | ICD-10-CM | POA: Diagnosis not present

## 2014-03-05 DIAGNOSIS — I509 Heart failure, unspecified: Secondary | ICD-10-CM | POA: Diagnosis not present

## 2014-03-05 DIAGNOSIS — I5021 Acute systolic (congestive) heart failure: Secondary | ICD-10-CM | POA: Diagnosis not present

## 2014-03-05 DIAGNOSIS — IMO0001 Reserved for inherently not codable concepts without codable children: Secondary | ICD-10-CM | POA: Diagnosis not present

## 2014-03-05 DIAGNOSIS — J449 Chronic obstructive pulmonary disease, unspecified: Secondary | ICD-10-CM | POA: Diagnosis not present

## 2014-03-09 DIAGNOSIS — I8310 Varicose veins of unspecified lower extremity with inflammation: Secondary | ICD-10-CM | POA: Diagnosis not present

## 2014-03-09 DIAGNOSIS — IMO0002 Reserved for concepts with insufficient information to code with codable children: Secondary | ICD-10-CM | POA: Diagnosis not present

## 2014-03-09 DIAGNOSIS — I10 Essential (primary) hypertension: Secondary | ICD-10-CM | POA: Diagnosis not present

## 2014-03-09 DIAGNOSIS — I509 Heart failure, unspecified: Secondary | ICD-10-CM | POA: Diagnosis not present

## 2014-03-10 DIAGNOSIS — J449 Chronic obstructive pulmonary disease, unspecified: Secondary | ICD-10-CM | POA: Diagnosis not present

## 2014-03-10 DIAGNOSIS — I509 Heart failure, unspecified: Secondary | ICD-10-CM | POA: Diagnosis not present

## 2014-03-10 DIAGNOSIS — I5021 Acute systolic (congestive) heart failure: Secondary | ICD-10-CM | POA: Diagnosis not present

## 2014-03-10 DIAGNOSIS — IMO0001 Reserved for inherently not codable concepts without codable children: Secondary | ICD-10-CM | POA: Diagnosis not present

## 2014-03-12 DIAGNOSIS — J449 Chronic obstructive pulmonary disease, unspecified: Secondary | ICD-10-CM | POA: Diagnosis not present

## 2014-03-12 DIAGNOSIS — I5021 Acute systolic (congestive) heart failure: Secondary | ICD-10-CM | POA: Diagnosis not present

## 2014-03-12 DIAGNOSIS — I509 Heart failure, unspecified: Secondary | ICD-10-CM | POA: Diagnosis not present

## 2014-03-12 DIAGNOSIS — IMO0001 Reserved for inherently not codable concepts without codable children: Secondary | ICD-10-CM | POA: Diagnosis not present

## 2014-03-16 ENCOUNTER — Encounter (HOSPITAL_BASED_OUTPATIENT_CLINIC_OR_DEPARTMENT_OTHER): Payer: Medicare Other

## 2014-03-16 DIAGNOSIS — J449 Chronic obstructive pulmonary disease, unspecified: Secondary | ICD-10-CM | POA: Diagnosis not present

## 2014-03-16 DIAGNOSIS — I509 Heart failure, unspecified: Secondary | ICD-10-CM | POA: Diagnosis not present

## 2014-03-16 DIAGNOSIS — IMO0001 Reserved for inherently not codable concepts without codable children: Secondary | ICD-10-CM | POA: Diagnosis not present

## 2014-03-16 DIAGNOSIS — I5021 Acute systolic (congestive) heart failure: Secondary | ICD-10-CM | POA: Diagnosis not present

## 2014-03-18 DIAGNOSIS — I5021 Acute systolic (congestive) heart failure: Secondary | ICD-10-CM | POA: Diagnosis not present

## 2014-03-18 DIAGNOSIS — J449 Chronic obstructive pulmonary disease, unspecified: Secondary | ICD-10-CM | POA: Diagnosis not present

## 2014-03-18 DIAGNOSIS — I509 Heart failure, unspecified: Secondary | ICD-10-CM | POA: Diagnosis not present

## 2014-03-18 DIAGNOSIS — IMO0001 Reserved for inherently not codable concepts without codable children: Secondary | ICD-10-CM | POA: Diagnosis not present

## 2014-03-23 DIAGNOSIS — I10 Essential (primary) hypertension: Secondary | ICD-10-CM | POA: Diagnosis not present

## 2014-03-23 DIAGNOSIS — I509 Heart failure, unspecified: Secondary | ICD-10-CM | POA: Diagnosis not present

## 2014-03-23 DIAGNOSIS — E785 Hyperlipidemia, unspecified: Secondary | ICD-10-CM | POA: Diagnosis not present

## 2014-03-23 DIAGNOSIS — Z1231 Encounter for screening mammogram for malignant neoplasm of breast: Secondary | ICD-10-CM | POA: Diagnosis not present

## 2014-03-23 DIAGNOSIS — Z1211 Encounter for screening for malignant neoplasm of colon: Secondary | ICD-10-CM | POA: Diagnosis not present

## 2014-03-23 DIAGNOSIS — I5021 Acute systolic (congestive) heart failure: Secondary | ICD-10-CM | POA: Diagnosis not present

## 2014-03-23 DIAGNOSIS — Z0000 Encounter for general adult medical examination without abnormal findings: Secondary | ICD-10-CM | POA: Diagnosis not present

## 2014-03-23 DIAGNOSIS — IMO0002 Reserved for concepts with insufficient information to code with codable children: Secondary | ICD-10-CM | POA: Diagnosis not present

## 2014-03-23 DIAGNOSIS — Z124 Encounter for screening for malignant neoplasm of cervix: Secondary | ICD-10-CM | POA: Diagnosis not present

## 2014-03-23 DIAGNOSIS — IMO0001 Reserved for inherently not codable concepts without codable children: Secondary | ICD-10-CM | POA: Diagnosis not present

## 2014-03-23 DIAGNOSIS — E559 Vitamin D deficiency, unspecified: Secondary | ICD-10-CM | POA: Diagnosis not present

## 2014-03-23 DIAGNOSIS — J449 Chronic obstructive pulmonary disease, unspecified: Secondary | ICD-10-CM | POA: Diagnosis not present

## 2014-03-25 DIAGNOSIS — I5021 Acute systolic (congestive) heart failure: Secondary | ICD-10-CM | POA: Diagnosis not present

## 2014-03-25 DIAGNOSIS — IMO0001 Reserved for inherently not codable concepts without codable children: Secondary | ICD-10-CM | POA: Diagnosis not present

## 2014-03-25 DIAGNOSIS — I509 Heart failure, unspecified: Secondary | ICD-10-CM | POA: Diagnosis not present

## 2014-03-25 DIAGNOSIS — J449 Chronic obstructive pulmonary disease, unspecified: Secondary | ICD-10-CM | POA: Diagnosis not present

## 2014-03-30 DIAGNOSIS — IMO0001 Reserved for inherently not codable concepts without codable children: Secondary | ICD-10-CM | POA: Diagnosis not present

## 2014-03-30 DIAGNOSIS — I5021 Acute systolic (congestive) heart failure: Secondary | ICD-10-CM | POA: Diagnosis not present

## 2014-03-30 DIAGNOSIS — J449 Chronic obstructive pulmonary disease, unspecified: Secondary | ICD-10-CM | POA: Diagnosis not present

## 2014-03-30 DIAGNOSIS — I509 Heart failure, unspecified: Secondary | ICD-10-CM | POA: Diagnosis not present

## 2014-03-31 DIAGNOSIS — Z1231 Encounter for screening mammogram for malignant neoplasm of breast: Secondary | ICD-10-CM | POA: Diagnosis not present

## 2014-04-01 DIAGNOSIS — I509 Heart failure, unspecified: Secondary | ICD-10-CM | POA: Diagnosis not present

## 2014-04-01 DIAGNOSIS — IMO0001 Reserved for inherently not codable concepts without codable children: Secondary | ICD-10-CM | POA: Diagnosis not present

## 2014-04-01 DIAGNOSIS — I5021 Acute systolic (congestive) heart failure: Secondary | ICD-10-CM | POA: Diagnosis not present

## 2014-04-01 DIAGNOSIS — J449 Chronic obstructive pulmonary disease, unspecified: Secondary | ICD-10-CM | POA: Diagnosis not present

## 2014-12-02 DIAGNOSIS — Z720 Tobacco use: Secondary | ICD-10-CM | POA: Diagnosis not present

## 2014-12-02 DIAGNOSIS — M5136 Other intervertebral disc degeneration, lumbar region: Secondary | ICD-10-CM | POA: Diagnosis not present

## 2014-12-02 DIAGNOSIS — I10 Essential (primary) hypertension: Secondary | ICD-10-CM | POA: Diagnosis not present

## 2014-12-02 DIAGNOSIS — I501 Left ventricular failure: Secondary | ICD-10-CM | POA: Diagnosis not present

## 2014-12-02 DIAGNOSIS — R635 Abnormal weight gain: Secondary | ICD-10-CM | POA: Diagnosis not present

## 2014-12-02 DIAGNOSIS — G4733 Obstructive sleep apnea (adult) (pediatric): Secondary | ICD-10-CM | POA: Diagnosis not present

## 2014-12-02 DIAGNOSIS — I8311 Varicose veins of right lower extremity with inflammation: Secondary | ICD-10-CM | POA: Diagnosis not present

## 2014-12-02 DIAGNOSIS — M1990 Unspecified osteoarthritis, unspecified site: Secondary | ICD-10-CM | POA: Diagnosis not present

## 2014-12-02 DIAGNOSIS — I509 Heart failure, unspecified: Secondary | ICD-10-CM | POA: Diagnosis not present

## 2015-06-13 DIAGNOSIS — R32 Unspecified urinary incontinence: Secondary | ICD-10-CM | POA: Diagnosis not present

## 2015-06-13 DIAGNOSIS — L304 Erythema intertrigo: Secondary | ICD-10-CM | POA: Diagnosis not present

## 2015-06-13 DIAGNOSIS — M179 Osteoarthritis of knee, unspecified: Secondary | ICD-10-CM | POA: Diagnosis not present

## 2015-06-13 DIAGNOSIS — R609 Edema, unspecified: Secondary | ICD-10-CM | POA: Diagnosis not present

## 2015-06-13 DIAGNOSIS — R159 Full incontinence of feces: Secondary | ICD-10-CM | POA: Diagnosis not present

## 2015-07-07 DIAGNOSIS — R609 Edema, unspecified: Secondary | ICD-10-CM | POA: Diagnosis not present

## 2015-08-09 DIAGNOSIS — J449 Chronic obstructive pulmonary disease, unspecified: Secondary | ICD-10-CM | POA: Diagnosis not present

## 2015-08-09 DIAGNOSIS — Z23 Encounter for immunization: Secondary | ICD-10-CM | POA: Diagnosis not present

## 2015-08-09 DIAGNOSIS — I5030 Unspecified diastolic (congestive) heart failure: Secondary | ICD-10-CM | POA: Diagnosis not present

## 2015-08-09 DIAGNOSIS — G4733 Obstructive sleep apnea (adult) (pediatric): Secondary | ICD-10-CM | POA: Diagnosis not present

## 2015-08-09 DIAGNOSIS — I10 Essential (primary) hypertension: Secondary | ICD-10-CM | POA: Diagnosis not present

## 2016-03-21 IMAGING — CT CT ANGIO CHEST
4 of 13 series · 19 of 46 positions shown · IV contrast (Omni 300)
Comparison: CTA chest dated 10/04/2009.

CLINICAL DATA: Shortness of breath, chest tightness, evaluate for
PE

EXAM:
CT ANGIOGRAPHY CHEST WITH CONTRAST
TECHNIQUE: Multidetector CT imaging of the chest was performed using the
standard protocol during bolus administration of intravenous
contrast. Multiplanar CT image reconstructions and MIPs were
obtained to evaluate the vascular anatomy.
CONTRAST:  170mL OMNIPAQUE IOHEXOL 350 MG/ML SOLN

[Series 4: pe 3.0 i30f 3 · axial · 0.64mm/px · z∈[-170,-77]mm · 2 of 95 slices shown]
[im 32/95  lung]
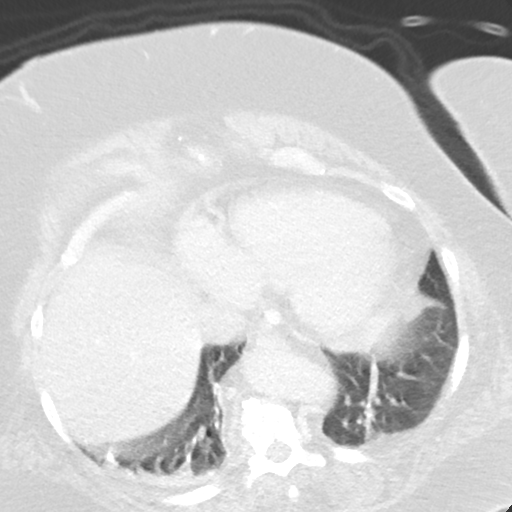
[im 63/95  lung]
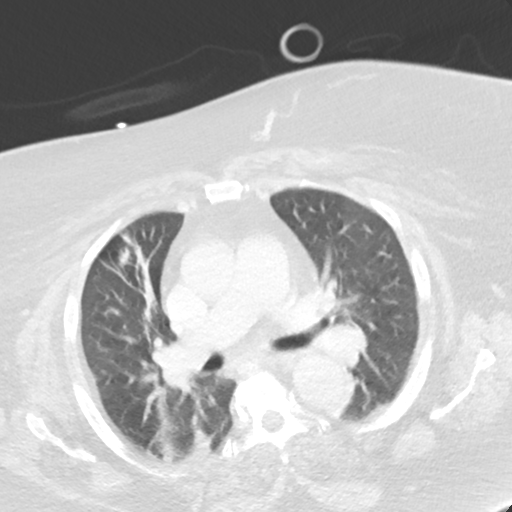

[Series 9: thins · axial · 0.70mm/px · z∈[-220,+4]mm · 12 of 266 slices shown]
[im 21/266  lung]
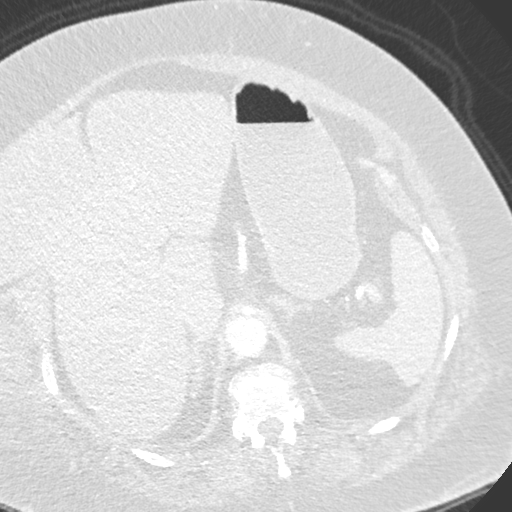
[im 41/266  soft-tissue]
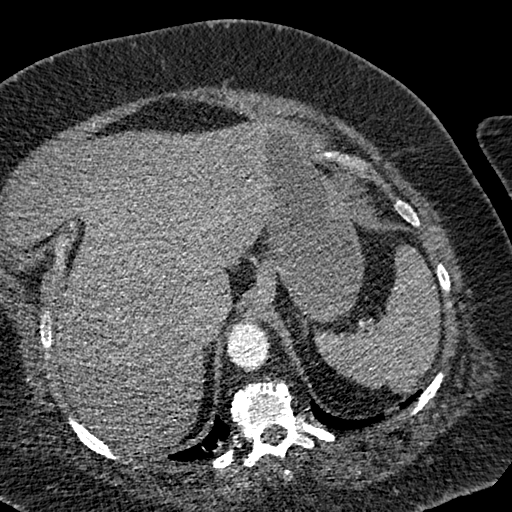
[im 62/266  lung]
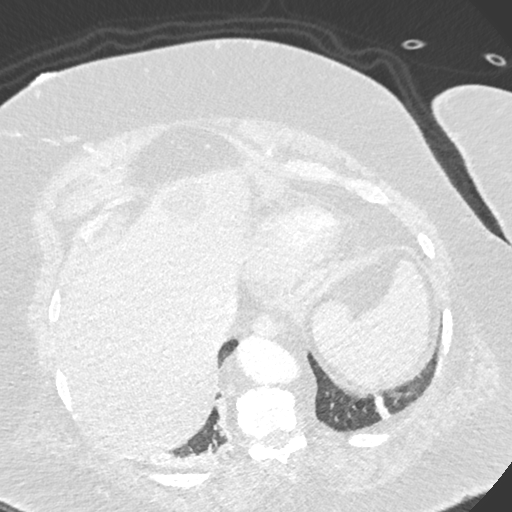
[im 82/266  soft-tissue]
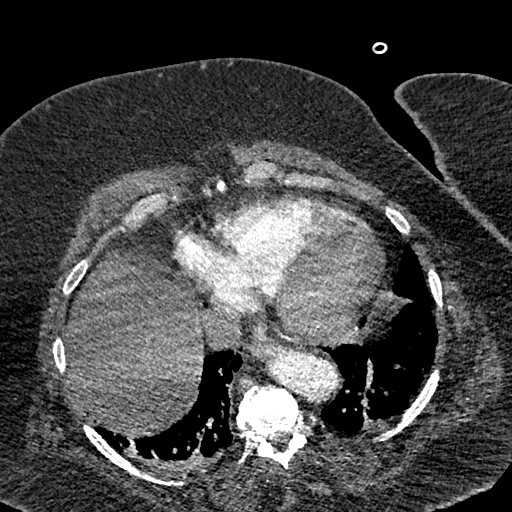
[im 102/266  lung]
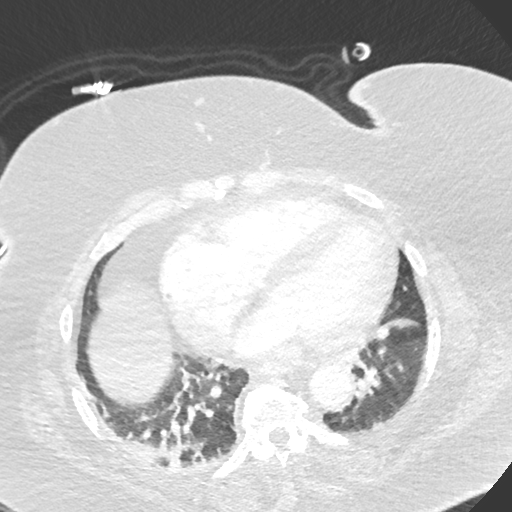
[im 123/266  soft-tissue]
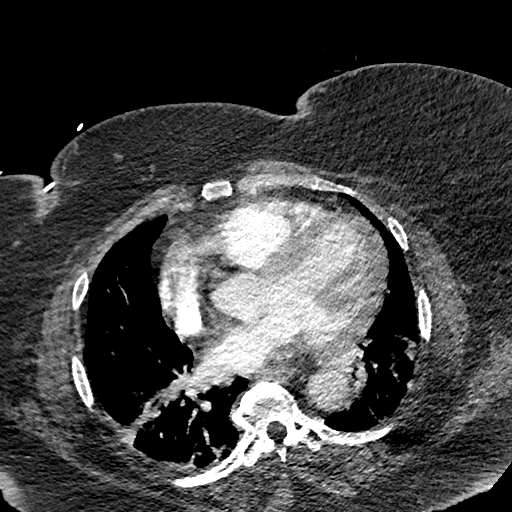
[im 143/266  lung]
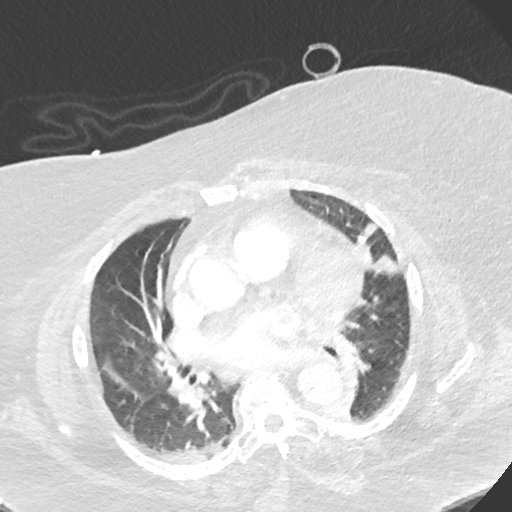
[im 164/266  soft-tissue]
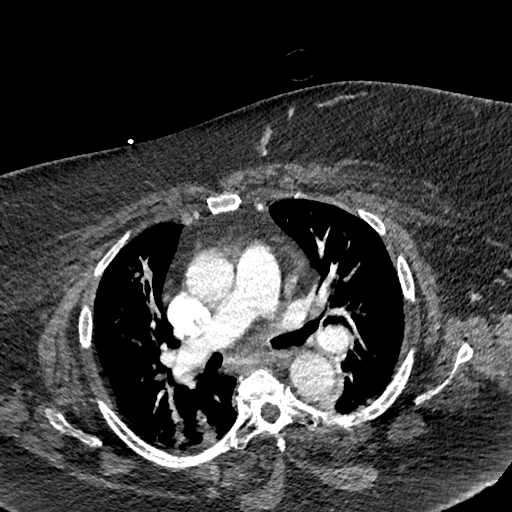
[im 184/266  lung]
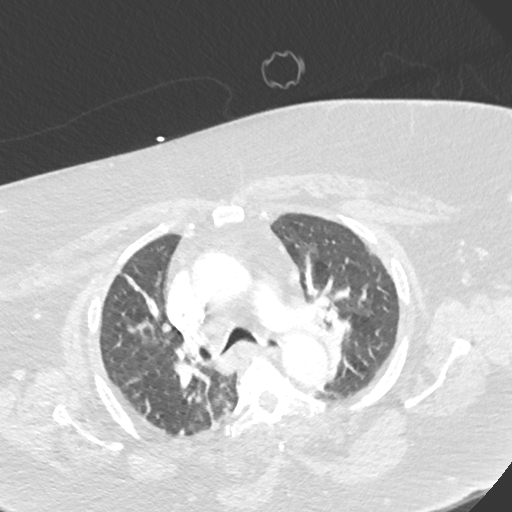
[im 204/266  soft-tissue]
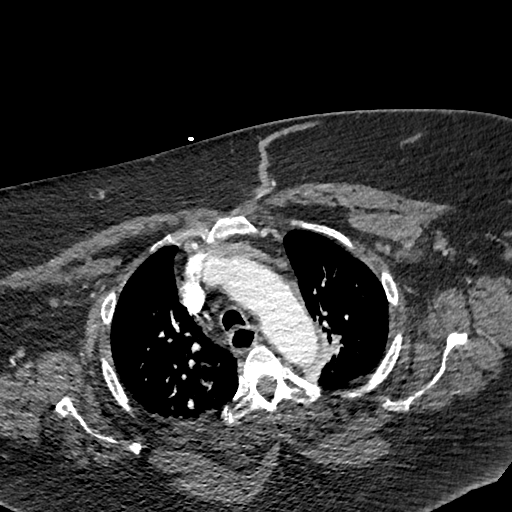
[im 225/266  lung]
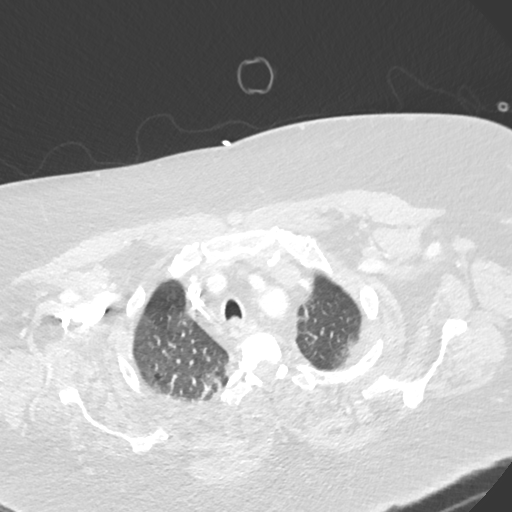
[im 245/266  soft-tissue]
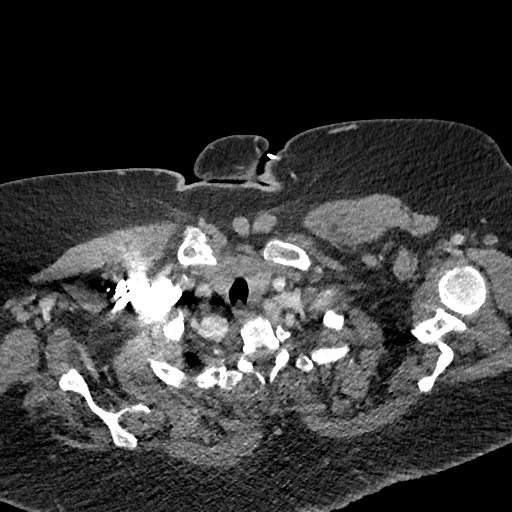

[Series 10: lung · axial · 0.86mm/px · z∈[-172,-103]mm · 2 of 91 slices shown]
[im 23/91  soft-tissue]
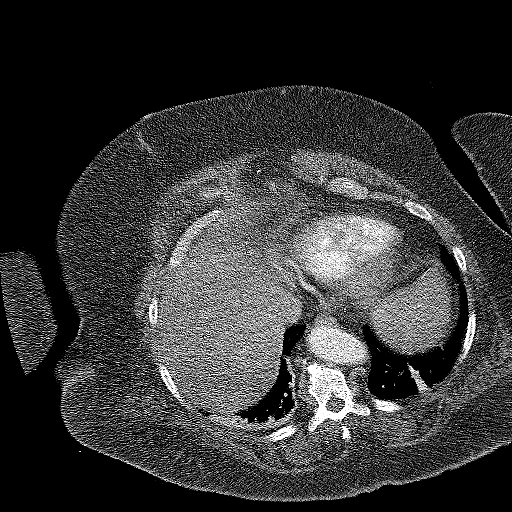
[im 46/91  soft-tissue]
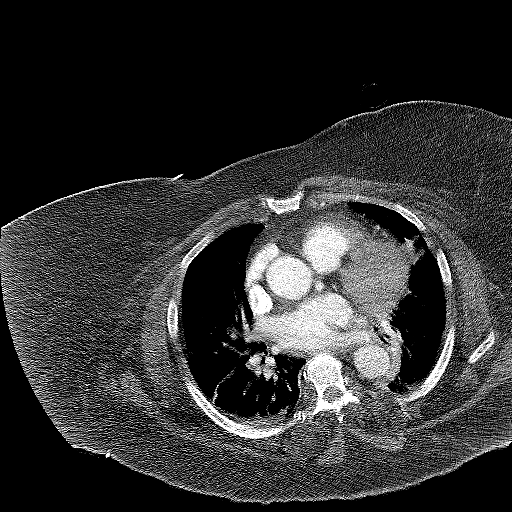

[Series 11: coronal mpr · coronal · 0.59mm/px · 3 of 111 slices shown]
[im 28/111  soft-tissue]
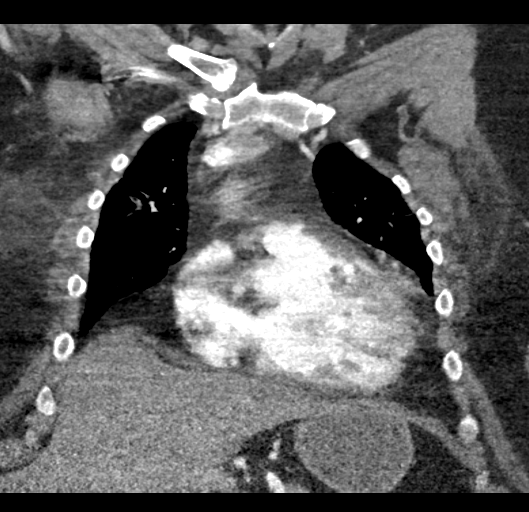
[im 56/111  soft-tissue]
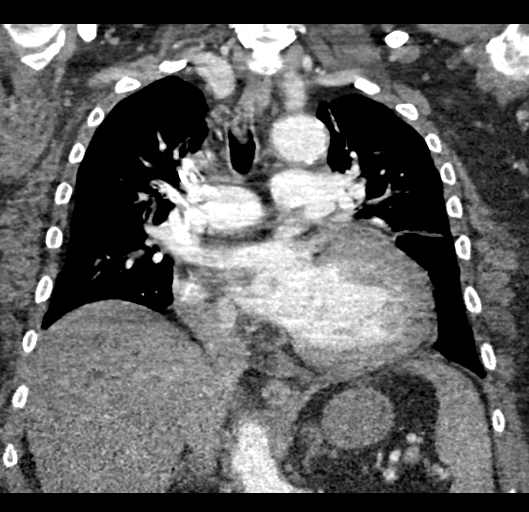
[im 83/111  soft-tissue]
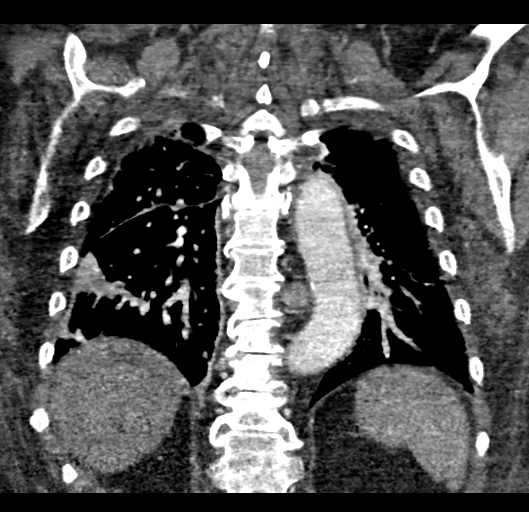

[19 of 46 positions shown; findings below may reference images not displayed]

FINDINGS: No evidence of pulmonary embolism.

Mild patchy right lower lobe opacity, likely atelectasis, pneumonia
not excluded. Additional left upper and lower lobe atelectasis.
Trace right pleural effusion. No pneumothorax.

Faint ground-glass opacities bilaterally (series 10/image 21),
favored to reflect mosaic attenuation/air trapping rather than
interstitial edema.

Cardiomegaly. No pericardial effusion. Mild atherosclerotic
calcifications of the aortic arch.

No suspicious mediastinal or axillary lymphadenopathy.

Visualized thyroid is unremarkable.

Visualized upper abdomen is notable for hepatic steatosis and a
cm left hepatic lobe cyst.

Degenerative changes of the visualized thoracolumbar spine.

Review of the MIP images confirms the above findings.
IMPRESSION: No evidence of pulmonary embolism.

Mild patchy right lower lobe opacity, likely atelectasis, pneumonia
not excluded.

Cardiomegaly. No frank interstitial edema. Trace right pleural
effusion.

## 2016-04-11 DIAGNOSIS — I10 Essential (primary) hypertension: Secondary | ICD-10-CM | POA: Diagnosis not present

## 2016-04-11 DIAGNOSIS — F322 Major depressive disorder, single episode, severe without psychotic features: Secondary | ICD-10-CM | POA: Diagnosis not present

## 2016-04-11 DIAGNOSIS — M170 Bilateral primary osteoarthritis of knee: Secondary | ICD-10-CM | POA: Diagnosis not present

## 2016-04-11 DIAGNOSIS — E782 Mixed hyperlipidemia: Secondary | ICD-10-CM | POA: Diagnosis not present

## 2016-04-11 DIAGNOSIS — I509 Heart failure, unspecified: Secondary | ICD-10-CM | POA: Diagnosis not present

## 2016-04-26 DIAGNOSIS — Z6841 Body Mass Index (BMI) 40.0 and over, adult: Secondary | ICD-10-CM | POA: Diagnosis not present

## 2016-04-26 DIAGNOSIS — R52 Pain, unspecified: Secondary | ICD-10-CM | POA: Diagnosis not present

## 2016-04-26 DIAGNOSIS — M170 Bilateral primary osteoarthritis of knee: Secondary | ICD-10-CM | POA: Diagnosis not present

## 2016-05-09 DIAGNOSIS — Z7409 Other reduced mobility: Secondary | ICD-10-CM | POA: Diagnosis not present

## 2016-05-09 DIAGNOSIS — I110 Hypertensive heart disease with heart failure: Secondary | ICD-10-CM | POA: Diagnosis not present

## 2016-05-09 DIAGNOSIS — E782 Mixed hyperlipidemia: Secondary | ICD-10-CM | POA: Diagnosis not present

## 2016-05-09 DIAGNOSIS — I509 Heart failure, unspecified: Secondary | ICD-10-CM | POA: Diagnosis not present

## 2016-05-09 DIAGNOSIS — M25562 Pain in left knee: Secondary | ICD-10-CM | POA: Diagnosis not present

## 2016-05-09 DIAGNOSIS — J449 Chronic obstructive pulmonary disease, unspecified: Secondary | ICD-10-CM | POA: Diagnosis not present

## 2016-05-09 DIAGNOSIS — Z9981 Dependence on supplemental oxygen: Secondary | ICD-10-CM | POA: Diagnosis not present

## 2016-05-09 DIAGNOSIS — M25561 Pain in right knee: Secondary | ICD-10-CM | POA: Diagnosis not present

## 2016-05-09 DIAGNOSIS — F329 Major depressive disorder, single episode, unspecified: Secondary | ICD-10-CM | POA: Diagnosis not present

## 2016-05-09 DIAGNOSIS — Z6841 Body Mass Index (BMI) 40.0 and over, adult: Secondary | ICD-10-CM | POA: Diagnosis not present

## 2016-05-15 DIAGNOSIS — D649 Anemia, unspecified: Secondary | ICD-10-CM | POA: Diagnosis not present

## 2016-05-15 DIAGNOSIS — I5021 Acute systolic (congestive) heart failure: Secondary | ICD-10-CM | POA: Diagnosis not present

## 2016-05-15 DIAGNOSIS — E876 Hypokalemia: Secondary | ICD-10-CM | POA: Diagnosis not present

## 2016-05-15 DIAGNOSIS — G8929 Other chronic pain: Secondary | ICD-10-CM | POA: Diagnosis not present

## 2016-05-15 DIAGNOSIS — Z1211 Encounter for screening for malignant neoplasm of colon: Secondary | ICD-10-CM | POA: Diagnosis not present

## 2016-05-15 DIAGNOSIS — I10 Essential (primary) hypertension: Secondary | ICD-10-CM | POA: Diagnosis not present

## 2016-05-15 DIAGNOSIS — M25562 Pain in left knee: Secondary | ICD-10-CM | POA: Diagnosis not present

## 2016-05-15 DIAGNOSIS — M25561 Pain in right knee: Secondary | ICD-10-CM | POA: Diagnosis not present

## 2016-05-16 DIAGNOSIS — I509 Heart failure, unspecified: Secondary | ICD-10-CM | POA: Diagnosis not present

## 2016-05-16 DIAGNOSIS — M25562 Pain in left knee: Secondary | ICD-10-CM | POA: Diagnosis not present

## 2016-05-16 DIAGNOSIS — M25561 Pain in right knee: Secondary | ICD-10-CM | POA: Diagnosis not present

## 2016-05-16 DIAGNOSIS — I110 Hypertensive heart disease with heart failure: Secondary | ICD-10-CM | POA: Diagnosis not present

## 2016-05-16 DIAGNOSIS — J449 Chronic obstructive pulmonary disease, unspecified: Secondary | ICD-10-CM | POA: Diagnosis not present

## 2016-05-16 DIAGNOSIS — F329 Major depressive disorder, single episode, unspecified: Secondary | ICD-10-CM | POA: Diagnosis not present

## 2016-05-24 DIAGNOSIS — M25561 Pain in right knee: Secondary | ICD-10-CM | POA: Diagnosis not present

## 2016-05-24 DIAGNOSIS — M25562 Pain in left knee: Secondary | ICD-10-CM | POA: Diagnosis not present

## 2016-05-24 DIAGNOSIS — I509 Heart failure, unspecified: Secondary | ICD-10-CM | POA: Diagnosis not present

## 2016-05-24 DIAGNOSIS — F329 Major depressive disorder, single episode, unspecified: Secondary | ICD-10-CM | POA: Diagnosis not present

## 2016-05-24 DIAGNOSIS — I110 Hypertensive heart disease with heart failure: Secondary | ICD-10-CM | POA: Diagnosis not present

## 2016-05-24 DIAGNOSIS — J449 Chronic obstructive pulmonary disease, unspecified: Secondary | ICD-10-CM | POA: Diagnosis not present

## 2016-05-30 DIAGNOSIS — J449 Chronic obstructive pulmonary disease, unspecified: Secondary | ICD-10-CM | POA: Diagnosis not present

## 2016-05-30 DIAGNOSIS — M25562 Pain in left knee: Secondary | ICD-10-CM | POA: Diagnosis not present

## 2016-05-30 DIAGNOSIS — I110 Hypertensive heart disease with heart failure: Secondary | ICD-10-CM | POA: Diagnosis not present

## 2016-05-30 DIAGNOSIS — F329 Major depressive disorder, single episode, unspecified: Secondary | ICD-10-CM | POA: Diagnosis not present

## 2016-05-30 DIAGNOSIS — M25561 Pain in right knee: Secondary | ICD-10-CM | POA: Diagnosis not present

## 2016-05-30 DIAGNOSIS — I509 Heart failure, unspecified: Secondary | ICD-10-CM | POA: Diagnosis not present

## 2016-06-05 DIAGNOSIS — I110 Hypertensive heart disease with heart failure: Secondary | ICD-10-CM | POA: Diagnosis not present

## 2016-06-05 DIAGNOSIS — F329 Major depressive disorder, single episode, unspecified: Secondary | ICD-10-CM | POA: Diagnosis not present

## 2016-06-05 DIAGNOSIS — J449 Chronic obstructive pulmonary disease, unspecified: Secondary | ICD-10-CM | POA: Diagnosis not present

## 2016-06-05 DIAGNOSIS — I509 Heart failure, unspecified: Secondary | ICD-10-CM | POA: Diagnosis not present

## 2016-06-05 DIAGNOSIS — M25561 Pain in right knee: Secondary | ICD-10-CM | POA: Diagnosis not present

## 2016-06-05 DIAGNOSIS — M25562 Pain in left knee: Secondary | ICD-10-CM | POA: Diagnosis not present

## 2016-06-20 DIAGNOSIS — J449 Chronic obstructive pulmonary disease, unspecified: Secondary | ICD-10-CM | POA: Diagnosis not present

## 2016-06-20 DIAGNOSIS — M25561 Pain in right knee: Secondary | ICD-10-CM | POA: Diagnosis not present

## 2016-06-20 DIAGNOSIS — I509 Heart failure, unspecified: Secondary | ICD-10-CM | POA: Diagnosis not present

## 2016-06-20 DIAGNOSIS — F329 Major depressive disorder, single episode, unspecified: Secondary | ICD-10-CM | POA: Diagnosis not present

## 2016-06-20 DIAGNOSIS — M25562 Pain in left knee: Secondary | ICD-10-CM | POA: Diagnosis not present

## 2016-06-20 DIAGNOSIS — I110 Hypertensive heart disease with heart failure: Secondary | ICD-10-CM | POA: Diagnosis not present

## 2016-07-02 DIAGNOSIS — I10 Essential (primary) hypertension: Secondary | ICD-10-CM | POA: Diagnosis not present

## 2016-07-02 DIAGNOSIS — D649 Anemia, unspecified: Secondary | ICD-10-CM | POA: Diagnosis not present

## 2016-07-02 DIAGNOSIS — Z23 Encounter for immunization: Secondary | ICD-10-CM | POA: Diagnosis not present

## 2016-07-03 DIAGNOSIS — M25562 Pain in left knee: Secondary | ICD-10-CM | POA: Diagnosis not present

## 2016-07-03 DIAGNOSIS — F329 Major depressive disorder, single episode, unspecified: Secondary | ICD-10-CM | POA: Diagnosis not present

## 2016-07-03 DIAGNOSIS — M25561 Pain in right knee: Secondary | ICD-10-CM | POA: Diagnosis not present

## 2016-07-03 DIAGNOSIS — J449 Chronic obstructive pulmonary disease, unspecified: Secondary | ICD-10-CM | POA: Diagnosis not present

## 2016-07-03 DIAGNOSIS — I110 Hypertensive heart disease with heart failure: Secondary | ICD-10-CM | POA: Diagnosis not present

## 2016-07-03 DIAGNOSIS — I509 Heart failure, unspecified: Secondary | ICD-10-CM | POA: Diagnosis not present

## 2017-04-05 DIAGNOSIS — G8929 Other chronic pain: Secondary | ICD-10-CM | POA: Diagnosis not present

## 2017-04-05 DIAGNOSIS — F322 Major depressive disorder, single episode, severe without psychotic features: Secondary | ICD-10-CM | POA: Diagnosis not present

## 2017-04-05 DIAGNOSIS — D509 Iron deficiency anemia, unspecified: Secondary | ICD-10-CM | POA: Diagnosis not present

## 2017-04-05 DIAGNOSIS — Z720 Tobacco use: Secondary | ICD-10-CM | POA: Diagnosis not present

## 2017-04-05 DIAGNOSIS — M25562 Pain in left knee: Secondary | ICD-10-CM | POA: Diagnosis not present

## 2017-04-05 DIAGNOSIS — I10 Essential (primary) hypertension: Secondary | ICD-10-CM | POA: Diagnosis not present

## 2017-04-05 DIAGNOSIS — J449 Chronic obstructive pulmonary disease, unspecified: Secondary | ICD-10-CM | POA: Diagnosis not present

## 2017-04-05 DIAGNOSIS — M25561 Pain in right knee: Secondary | ICD-10-CM | POA: Diagnosis not present

## 2017-04-05 DIAGNOSIS — N289 Disorder of kidney and ureter, unspecified: Secondary | ICD-10-CM | POA: Diagnosis not present

## 2017-04-05 DIAGNOSIS — B354 Tinea corporis: Secondary | ICD-10-CM | POA: Diagnosis not present
